# Patient Record
Sex: Female | Born: 1975 | Race: White | Hispanic: No | Marital: Single | Smoking: Never smoker
Health system: Southern US, Community
[De-identification: ages and names within clinical notes are randomized; demographics above are authoritative.]

## PROBLEM LIST (undated history)

## (undated) DIAGNOSIS — F502 Bulimia nervosa, unspecified: Secondary | ICD-10-CM

## (undated) DIAGNOSIS — R51 Headache: Secondary | ICD-10-CM

## (undated) DIAGNOSIS — F329 Major depressive disorder, single episode, unspecified: Secondary | ICD-10-CM

## (undated) DIAGNOSIS — F319 Bipolar disorder, unspecified: Secondary | ICD-10-CM

## (undated) DIAGNOSIS — R7302 Impaired glucose tolerance (oral): Secondary | ICD-10-CM

## (undated) DIAGNOSIS — F32A Depression, unspecified: Secondary | ICD-10-CM

## (undated) DIAGNOSIS — R519 Headache, unspecified: Secondary | ICD-10-CM

## (undated) DIAGNOSIS — R630 Anorexia: Secondary | ICD-10-CM

## (undated) HISTORY — DX: Anorexia: R63.0

## (undated) HISTORY — DX: Bulimia nervosa, unspecified: F50.20

## (undated) HISTORY — DX: Bipolar disorder, unspecified: F31.9

## (undated) HISTORY — DX: Headache: R51

## (undated) HISTORY — DX: Depression, unspecified: F32.A

## (undated) HISTORY — DX: Headache, unspecified: R51.9

## (undated) HISTORY — DX: Impaired glucose tolerance (oral): R73.02

## (undated) HISTORY — DX: Major depressive disorder, single episode, unspecified: F32.9

## (undated) HISTORY — DX: Bulimia nervosa: F50.2

---
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
## 2008-02-25 ENCOUNTER — Emergency Department (HOSPITAL_COMMUNITY): Admission: EM | Admit: 2008-02-25 | Discharge: 2008-02-25 | Payer: Self-pay | Admitting: Family Medicine

## 2011-07-09 LAB — POCT URINALYSIS DIP (DEVICE)
Bilirubin Urine: NEGATIVE
Ketones, ur: NEGATIVE
Nitrite: POSITIVE — AB
Operator id: 247071
pH: 5.5

## 2013-10-26 ENCOUNTER — Encounter (INDEPENDENT_AMBULATORY_CARE_PROVIDER_SITE_OTHER): Payer: Self-pay

## 2013-10-26 ENCOUNTER — Encounter: Payer: Self-pay | Admitting: Neurology

## 2013-10-26 ENCOUNTER — Ambulatory Visit (INDEPENDENT_AMBULATORY_CARE_PROVIDER_SITE_OTHER): Payer: BC Managed Care – PPO | Admitting: Neurology

## 2013-10-26 VITALS — BP 129/78 | HR 82 | Ht 72.25 in | Wt 208.0 lb

## 2013-10-26 DIAGNOSIS — R404 Transient alteration of awareness: Secondary | ICD-10-CM

## 2013-10-26 DIAGNOSIS — R55 Syncope and collapse: Secondary | ICD-10-CM

## 2013-10-26 DIAGNOSIS — R519 Headache, unspecified: Secondary | ICD-10-CM | POA: Insufficient documentation

## 2013-10-26 DIAGNOSIS — R4020 Unspecified coma: Secondary | ICD-10-CM | POA: Insufficient documentation

## 2013-10-26 DIAGNOSIS — R51 Headache: Secondary | ICD-10-CM

## 2013-10-26 MED ORDER — TOPIRAMATE 25 MG PO TABS: 25.0000 mg | ORAL_TABLET | Freq: Two times a day (BID) | ORAL | Status: DC

## 2013-10-26 NOTE — Progress Notes (Signed)
[facility]    Provider:  Dr Hosie PoissonSumner Referring Provider: Minda MeoAronson, Richard A, MD Primary Care Physician:  Minda MeoARONSON,RICHARD A, MD  CC:  AMS  HPI:  Aimee Wright is a 38 y.o. female here as a referral from Dr. Jacky KindleAronson for AMS  Patient having multiple episodes of LOC, severe nausea, really bad headaches, fatigue, depression. Has been getting progressively worse over the past few months. She notes steady increase in headaches, have nausea with them. Increased level of fatigue. Initially thought it was bipolar but has not come out of the down. In the past 6 weeks has had 4 episodes of LOC, typically occur after a severe headache. Had similar symptoms in the past, evaluated by neurologist, told symptoms were likely related to anxiety. Had been stable up until few months ago.  Headaches are typically whole head, can be bifrontal. Headaches are typically a squeezing type pain with some pulsating. Gets nausea. No blurry vision with the headache. Some photophobia. Gets some dizzy sensation. Typically will have a baseline daily headache that fluctuates. No focal motor or sensory changes. Gets an aura of a "weird sensation" prior to the headache.   Most recent LOC episode was Monday. Monday night notes feeling very fatigued, came on suddenly, stood up, felt strange, drove home, recalls this was around 10pm. Next thing she recalls is waking up on the floor the next morning. No tongue biting, no loss of urinary incontinence. Prior events have been similar. Only one event has been witnessed. Told she gets pale, gets "out of it" and then drops, told only out for a few minutes, confused upon waking up. No shaking episodes. Prior to event, gets palpitations, diaphoretic, graying out of vision.   Has history of Bipolar and depression for which she is followed by psychiatry. Has been on a regimen of lamictal and brintellix (along with klonopin PRN) with seroquel recently added.   Had autoimmune,  room workup by outside physician, per patient was unremarkable.   Review of Systems: Out of a complete 14 system review, the patient complains of only the following symptoms, and all other reviewed systems are negative. Positive for fatigue easy bruising feeling cold increased thirst dizziness passing out headaches sleepiness restless legs depression anxiety too much sleep decreased energy change in appetite disinterest in activities  History   Social History  . Marital Status: Single    Spouse Name: N/A    Number of Children: 0  . Years of Education: Masters   Occupational History  . Social Worker    Social History Main Topics  . Smoking status: Never Smoker   . Smokeless tobacco: Not on file  . Alcohol Use: 0.0 oz/week     Comment: 1 glass a wine daily   . Drug Use: No  . Sexual Activity: Not on file   Other Topics Concern  . Not on file   Social History Narrative   Patient lives at home with boyfriend   Patient is right handed   Education level is Masters degree   Caffeine consumption is 2 cups per day    Family History  Problem Relation Age of Onset  . Cancer - Colon Maternal Grandfather     Past Medical History  Diagnosis Date  . Impaired glucose tolerance   . Anorexia   . Bulimia   . Depression   . Bipolar disorder, unspecified   . Generalized headaches     Past Surgical History  Procedure Laterality Date  . Knee surgery  1997  Current Outpatient Prescriptions  Medication Sig Dispense Refill  . BRINTELLIX 10 MG TABS Take 10 mg by mouth daily.      Marland Kitchen lamoTRIgine (LAMICTAL) 100 MG tablet Take 100 mg by mouth 2 (two) times daily.      Marland Kitchen LORazepam (ATIVAN) 0.5 MG tablet Take 0.5 mg by mouth every 8 (eight) hours.      Marland Kitchen QUEtiapine (SEROQUEL) 25 MG tablet Take 25 mg by mouth at bedtime.       No current facility-administered medications for this visit.    Allergies as of 10/26/2013  . (No Known Allergies)    Vitals: BP 129/78  Pulse 82  Ht  6' 0.25" (1.835 m)  Wt 208 lb (94.348 kg)  BMI 28.02 kg/m2 Last Weight:  Wt Readings from Last 1 Encounters:  10/26/13 208 lb (94.348 kg)   Last Height:   Ht Readings from Last 1 Encounters:  10/26/13 6' 0.25" (1.835 m)     Physical exam: Exam: Gen: NAD, conversant Eyes: anicteric sclerae, moist conjunctivae HENT: Atraumatic, oropharynx clear Neck: Trachea midline; supple,  Lungs: CTA, no wheezing, rales, rhonic                          CV: RRR, no MRG Abdomen: Soft, non-tender;  Extremities: No peripheral edema  Skin: Normal temperature, no rash,  Psych: Appropriate affect, pleasant  Neuro: MS: AA&Ox3, appropriately interactive, normal affect   Speech: fluent w/o paraphasic error  Memory: good recent and remote recall  CN: PERRL, EOMI no nystagmus, no ptosis, sensation intact to LT V1-V3 bilat, face symmetric, no weakness, hearing grossly intact, palate elevates symmetrically, shoulder shrug 5/5 bilat,  tongue protrudes midline, no fasiculations noted.  Motor: normal bulk and tone Strength: 5/5  In all extremities  Coord: rapid alternating and point-to-point (FNF, HTS) movements intact.  Reflexes: symmetrical, bilat downgoing toes  Sens: LT intact in all extremities  Gait: posture, stance, stride and arm-swing normal. Tandem gait intact. Able to walk on heels and toes. Romberg absent.   Assessment:  After physical and neurologic examination, review of laboratory studies, imaging, neurophysiology testing and pre-existing records, assessment will be reviewed on the problem list.  Plan:  Treatment plan and additional workup will be reviewed under Problem List.  1)Headache 2)Loss of consciousness 3)Bipolar/depression  38 year old woman with history of bipolar disorder and anxiety presenting for initial evaluation of headache, episodes of loss of consciousness and fatigue. These are negative progressively worse over the past few months. She was recently evaluated  by her psychiatrist who added Seroquel nightly. Unclear etiology of her symptoms. Physical exam is overall unremarkable, making a central process less likely. Headaches appear migrainous in nature. Unclear cause of episodes of loss of consciousness, differential would include seizure versus syncope versus stress anxiety related. Will check EEG, carotid ultrasound, 2-D echo. Patient has had a brain MRI in the past which was unremarkable. Will hold off on repeat imaging at this time. Will start patient on Topamax 25 mg twice a day for symptomatic relief of headache. Followup in one to 2 months. Patient counseled to avoid driving as workup is undergoing.   Elspeth Cho, DO  [facility] [address] [place], [place] [address]  Phone [phone] Fax [phone]

## 2013-10-26 NOTE — Patient Instructions (Addendum)
Overall you are doing fairly well but I do want to suggest a few things today:   Remember to drink plenty of fluid, eat healthy meals and do not skip any meals. Try to eat protein with a every meal and eat a healthy snack such as fruit or nuts in between meals. Try to keep a regular sleep-wake schedule and try to exercise daily, particularly in the form of walking, 20-30 minutes a day, if you can.   As far as your medications are concerned, I would like to suggest the following: 1)Start Topamax 25mg  twice a day 2)Use ibuprofen as needed for headache relief  As far as diagnostic testing:  1)Please schedule an EEG, carotid ultrasound and 2D echo of your heart to evaluate for possible causes of your blacking out episodes.   I would like to see you back in 1 to 2 months, sooner if we need to. Please call us with any interim questions, concerns, problems, updates or refill requests.   Please refrain from driving while we continue working up the cause of your symptoms  Please also call us for any test results so we can go over those with you on the phone.  My clinical assistant and will answer any of your questions and relay your messages to me and also relay most of my messages to you.   Our phone number is [phone]. We also have an after hours call service for urgent matters and there is a physician on-call for urgent questions. For any emergencies you know to call 911 or go to the nearest emergency room

## 2013-10-27 NOTE — Addendum Note (Signed)
Addended by: Ramond MarrowSUMNER, Krist Rosenboom J on: 10/27/2013 03:41 PM   Modules accepted: Orders

## 2013-11-03 ENCOUNTER — Ambulatory Visit (INDEPENDENT_AMBULATORY_CARE_PROVIDER_SITE_OTHER): Payer: BC Managed Care – PPO | Admitting: Radiology

## 2013-11-03 DIAGNOSIS — R55 Syncope and collapse: Secondary | ICD-10-CM

## 2013-11-03 DIAGNOSIS — R4020 Unspecified coma: Secondary | ICD-10-CM

## 2013-11-03 DIAGNOSIS — R404 Transient alteration of awareness: Secondary | ICD-10-CM

## 2013-11-15 ENCOUNTER — Other Ambulatory Visit (HOSPITAL_COMMUNITY): Payer: Self-pay

## 2013-11-18 ENCOUNTER — Ambulatory Visit (INDEPENDENT_AMBULATORY_CARE_PROVIDER_SITE_OTHER): Payer: BC Managed Care – PPO

## 2013-11-18 DIAGNOSIS — R55 Syncope and collapse: Secondary | ICD-10-CM

## 2013-11-18 DIAGNOSIS — G9389 Other specified disorders of brain: Secondary | ICD-10-CM

## 2013-11-18 DIAGNOSIS — R4020 Unspecified coma: Secondary | ICD-10-CM

## 2013-11-24 ENCOUNTER — Telehealth: Payer: Self-pay | Admitting: Neurology

## 2013-11-24 NOTE — Telephone Encounter (Signed)
Patient's mother calling requesting if patient could be seen by Dr. Vickey Hugerohmeier or Dr. Hosie PoissonSumner tomorrow if there are cancellations as she is feeling quite ill. She does have an appointment next Tuesday. Please call to advise [phone]

## 2013-11-24 NOTE — Procedures (Signed)
   [facility]  EEG (ELECTROENCEPHALOGRAM) REPORT   STUDY DATE: 11/03/13 PATIENT NAME: Aimee Wright DOB: 10/09/1976 MRN: [id number]  ORDERING CLINICIAN: Elspeth ChoPeter Sumner, DO   TECHNOLOGIST: Kaylyn LimSue Fox TECHNIQUE: Electroencephalogram was recorded utilizing standard 10-20 system of lead placement and reformatted into average and bipolar montages.  RECORDING TIME: 30 minutes ACTIVATION: hyperventilation and photic stimulation  CLINICAL INFORMATION: 38 year old female with seizure vs syncope.  FINDINGS: Background rhythms of 9-10 hertz and 60-70 microvolts. No focal, lateralizing, epileptiform activity or seizures are seen. Patient recorded in the awake state.   IMPRESSION:  Normal EEG in the awake state.   INTERPRETING PHYSICIAN:  Suanne MarkerVIKRAM R. Xachary Hambly, MD Certified in Neurology, Neurophysiology and Neuroimaging  [facility] [address], [address] [place], [place] [address] [phone]

## 2013-11-25 ENCOUNTER — Other Ambulatory Visit: Payer: Self-pay | Admitting: Neurology

## 2013-11-25 ENCOUNTER — Telehealth: Payer: Self-pay | Admitting: Neurology

## 2013-11-25 MED ORDER — MECLIZINE HCL 12.5 MG PO TABS: 12.5000 mg | ORAL_TABLET | Freq: Three times a day (TID) | ORAL | Status: DC | PRN

## 2013-11-25 NOTE — Telephone Encounter (Signed)
Patient is requesting a sooner appt.( for dizziness and  Headaches) or cancellation slot today, informed that Dr Hosie PoissonSumner does not have any available today, she is scheduled for visit on 11/29/13, spoke with father and he said that she would also like the results of EEG completed on 11/24/13.

## 2013-11-25 NOTE — Telephone Encounter (Signed)
Returned call, discussed with patients mother. Will start meclizine 12.5mg  TID as needed. Follow up with scheduled appointment.

## 2013-11-25 NOTE — Telephone Encounter (Signed)
Called patient to reschedule her appointment with Dr. Hosie PoissonSumner on 11/29/13, no answer left message to return call.

## 2013-11-28 NOTE — Telephone Encounter (Signed)
Called patient again to reschedule her appointment on 02/17 with Hosie PoissonSumner, due to provider schedule change

## 2013-11-28 NOTE — Telephone Encounter (Signed)
Spoke with patients mother Carol, appoinOkey Regaltment changed to 3:30 pm per Manson PasseySumner, Carol Heinemann states that this time works better for patient

## 2013-11-29 ENCOUNTER — Encounter (INDEPENDENT_AMBULATORY_CARE_PROVIDER_SITE_OTHER): Payer: Self-pay

## 2013-11-29 ENCOUNTER — Encounter: Payer: Self-pay | Admitting: Neurology

## 2013-11-29 ENCOUNTER — Ambulatory Visit (INDEPENDENT_AMBULATORY_CARE_PROVIDER_SITE_OTHER): Payer: BC Managed Care – PPO | Admitting: Neurology

## 2013-11-29 ENCOUNTER — Telehealth: Payer: Self-pay | Admitting: Neurology

## 2013-11-29 VITALS — BP 117/73 | HR 96 | Ht 72.0 in | Wt 213.0 lb

## 2013-11-29 DIAGNOSIS — R4182 Altered mental status, unspecified: Secondary | ICD-10-CM

## 2013-11-29 NOTE — Patient Instructions (Signed)
Overall you are doing fairly well but I do want to suggest a few things today:   Remember to drink plenty of fluid, eat healthy meals and do not skip any meals. Try to eat protein with a every meal and eat a healthy snack such as fruit or nuts in between meals. Try to keep a regular sleep-wake schedule and try to exercise daily, particularly in the form of walking, 20-30 minutes a day, if you can.   As far as your medications are concerned, I would like to suggest the following: 1)Please continue on the Topamax as prescribed  [facility]  I would like to see you back once the EEG is completed, sooner if we need to. Please call us with any interim questions, concerns, problems, updates or refill requests.   My clinical assistant and will answer any of your questions and relay your messages to me and also relay most of my messages to you.   Our phone number is [phone]. We also have an after hours call service for urgent matters and there is a physician on-call for urgent questions. For any emergencies you know to call 911 or go to the nearest emergency room

## 2013-11-29 NOTE — Telephone Encounter (Signed)
Patient called returning my call about her appointment today with Dr. Hosie PoissonSumner, patient agreed to arrive early states she will be here at 2pm
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
## 2013-12-05 ENCOUNTER — Telehealth: Payer: Self-pay | Admitting: Neurology

## 2013-12-05 NOTE — Telephone Encounter (Signed)
PT called and wanted to let Dr. Hosie PoissonSumner know that she had another episode - "black out" last night.  She did not go to work today as she is having extreme dizziness and disorientation.  The other thing she asked is if Dr. Hosie PoissonSumner could change the referral out that [facility] was going to [facility].  Please call with any questions.  Thank you

## 2013-12-05 NOTE — Telephone Encounter (Signed)
Hi Sandy,   Can you please change the ambulatory EEG referral to [facility]. Thanks.

## 2013-12-07 ENCOUNTER — Other Ambulatory Visit: Payer: Self-pay | Admitting: *Deleted

## 2013-12-07 DIAGNOSIS — R4182 Altered mental status, unspecified: Secondary | ICD-10-CM

## 2013-12-07 NOTE — Addendum Note (Signed)
Addended byHermenia Fiscal: Ily Denno on: 12/07/2013 11:38 AM   Modules accepted: Orders

## 2013-12-12 NOTE — Telephone Encounter (Signed)
Shree from [facility] calling to state that she can't make the patient's EEG appointment unless she has the doctor's name and whenever she tries to type it, Dr. Minus BreedingSumner's name isn't coming up. Please call her and advise at [phone].

## 2013-12-22 ENCOUNTER — Encounter: Payer: Self-pay | Admitting: Neurology

## 2013-12-28 ENCOUNTER — Telehealth: Payer: Self-pay | Admitting: Neurology

## 2013-12-28 NOTE — Telephone Encounter (Signed)
[facility] and they said that patient has been scheduled for 01/09/14@1 :00,arrival time-1:00, patient verbalized understanding, gave her their phone number if she needs to call.

## 2013-12-28 NOTE — Telephone Encounter (Signed)
Patient calling to state that the number that Dr. Hosie PoissonSumner gave her for her [facility] referral was not the right number, patient calling to request the correct number. Please call and advise patient.

## 2013-12-28 NOTE — Telephone Encounter (Signed)
Patient informed of appt.

## 2014-03-27 ENCOUNTER — Other Ambulatory Visit: Payer: Self-pay | Admitting: Internal Medicine

## 2014-03-27 DIAGNOSIS — R55 Syncope and collapse: Secondary | ICD-10-CM

## 2014-03-29 ENCOUNTER — Ambulatory Visit
Admission: RE | Admit: 2014-03-29 | Discharge: 2014-03-29 | Disposition: A | Discharge status: Home / Self Care | Payer: BC Managed Care – PPO | Source: Ambulatory Visit | Attending: Internal Medicine | Admitting: Internal Medicine

## 2014-03-29 DIAGNOSIS — R55 Syncope and collapse: Secondary | ICD-10-CM

## 2014-07-28 ENCOUNTER — Other Ambulatory Visit: Payer: Self-pay

## 2016-07-31 ENCOUNTER — Ambulatory Visit (HOSPITAL_COMMUNITY)
Admission: EM | Admit: 2016-07-31 | Discharge: 2016-07-31 | Disposition: A | Discharge status: Home / Self Care | Payer: Self-pay | Attending: Family Medicine | Admitting: Family Medicine

## 2016-07-31 ENCOUNTER — Encounter (HOSPITAL_COMMUNITY): Payer: Self-pay | Admitting: Emergency Medicine

## 2016-07-31 DIAGNOSIS — S40011A Contusion of right shoulder, initial encounter: Secondary | ICD-10-CM

## 2016-07-31 DIAGNOSIS — M25511 Pain in right shoulder: Secondary | ICD-10-CM

## 2016-07-31 MED ORDER — TRAMADOL HCL 50 MG PO TABS: 50.0000 mg | ORAL_TABLET | Freq: Four times a day (QID) | ORAL | 0 refills | Status: DC | PRN

## 2016-07-31 MED ORDER — DICLOFENAC SODIUM 1 % TD GEL
1.0000 | Freq: Four times a day (QID) | TRANSDERMAL | 0 refills | Status: DC
Start: 2016-07-31 — End: 2020-06-28

## 2016-07-31 MED ORDER — NAPROXEN 375 MG PO TABS: 375.0000 mg | ORAL_TABLET | Freq: Two times a day (BID) | ORAL | 0 refills | Status: DC

## 2016-07-31 NOTE — ED Triage Notes (Signed)
Pt was walking through a sliding glass door when the entire framework fell on her right shoulder, near her neck.  Pt had to hold the frame up until people came to help get it off.  She has pain and tenderness all along the area between her neck and her shoulder.

## 2016-07-31 NOTE — ED Provider Notes (Signed)
CSN: [id number]     Arrival date & time 07/31/16  1800 History   None    Chief Complaint  Patient presents with  . Shoulder Pain   (Consider location/radiation/quality/duration/timing/severity/associated sxs/prior Treatment) 40 year old female [place] and the door frame which was being worked on at the time fell over her head and struck her in the right shoulder along the ridge of the trapezius. She is currently complaining of pain over the trapezius muscle as well as the supraspinatus muscle. Later she developed some minor paresthesias of the right forearm. She also developed lower para thoracic and upper lumbar muscle soreness. She is fully ambulatory and showing no signs of distress.      Past Medical History:  Diagnosis Date  . Anorexia   . Bipolar disorder, unspecified   . Bulimia   . Depression   . Generalized headaches   . Impaired glucose tolerance    Past Surgical History:  Procedure Laterality Date  . KNEE SURGERY  1997   Family History  Problem Relation Age of Onset  . Cancer - Colon Maternal Grandfather    Social History  Substance Use Topics  . Smoking status: Never Smoker  . Smokeless tobacco: Never Used  . Alcohol use 0.0 oz/week     Comment: 1 glass a wine daily    OB History    No data available     Review of Systems  Constitutional: Negative for activity change, chills and fever.  HENT: Negative.   Respiratory: Negative.   Cardiovascular: Negative.   Musculoskeletal: Positive for myalgias. Negative for arthralgias, neck pain and neck stiffness.       As per HPI  Skin: Negative for color change, pallor and rash.  Neurological: Negative.   All other systems reviewed and are negative.   Allergies  Review of patient's allergies indicates no known allergies.  Home Medications   Prior to Admission medications   Medication Sig Start Date End Date Taking? Authorizing Provider  BRINTELLIX 10 MG TABS  Take 10 mg by mouth daily. 10/17/13  Yes Historical Provider, MD  lamoTRIgine (LAMICTAL) 100 MG tablet Take 100 mg by mouth 2 (two) times daily. 09/25/13  Yes Historical Provider, MD  LORazepam (ATIVAN) 0.5 MG tablet Take 0.5 mg by mouth every 8 (eight) hours.   Yes Historical Provider, MD  QUEtiapine (SEROQUEL) 25 MG tablet Take 25 mg by mouth at bedtime.   Yes Historical Provider, MD  topiramate (TOPAMAX) 25 MG tablet Take 1 tablet (25 mg total) by mouth 2 (two) times daily. 10/26/13  Yes Ramond MarrowPeter J Sumner, DO  diclofenac sodium (VOLTAREN) 1 % GEL Apply 1 application topically 4 (four) times daily. 07/31/16   Hayden Rasmussenavid Marlise Fahr, NP  meclizine (ANTIVERT) 12.5 MG tablet Take 1 tablet (12.5 mg total) by mouth 3 (three) times daily as needed for dizziness. 11/25/13   Ramond MarrowPeter J Sumner, DO  naproxen (NAPROSYN) 375 MG tablet Take 1 tablet (375 mg total) by mouth 2 (two) times daily. 07/31/16   Hayden Rasmussenavid Aryn Kops, NP  traMADol (ULTRAM) 50 MG tablet Take 1 tablet (50 mg total) by mouth every 6 (six) hours as needed. 07/31/16   Hayden Rasmussenavid Joni Norrod, NP   Meds Ordered and Administered this Visit  Medications - No data to display  BP 121/67 (BP Location: Left Arm)   Pulse 77   Temp 98.6 F (37 C) (Oral)   Resp 16   LMP 07/07/2016 (Exact Date)   SpO2 100%  No data found.   Physical Exam  Constitutional: She is oriented to person, place, and time. She appears well-developed and well-nourished. No distress.  HENT:  Head: Normocephalic and atraumatic.  Eyes: EOM are normal. Pupils are equal, round, and reactive to light.  Neck: Normal range of motion. Neck supple.  Cardiovascular: Normal rate.   Pulmonary/Chest: Effort normal and breath sounds normal.  Musculoskeletal: Normal range of motion.  Tenderness across the ridge of the right trapezius muscle. Tenderness along the scapular spine and supraspinatus muscle. patient is able to shrug her shoulders completely. Range of motion of the right shoulder joint is complete and without  limitation. No local joint tenderness. There is no swelling or discoloration. Minor tenderness to the lower parathoracic and upper paralumbar musculature. No tenderness, discoloration, swelling or deformity along the length of the spine. Right upper extremity strength is 5 over 5. Full range of motion of the right upper extremity radial pulse 2+. Distal neurovascular motor sensory intact.   Lymphadenopathy:    She has no cervical adenopathy.  Neurological: She is alert and oriented to person, place, and time. No cranial nerve deficit.  Skin: Skin is warm and dry. Capillary refill takes less than 2 seconds.  Psychiatric: She has a normal mood and affect.  Nursing note and vitals reviewed.   Urgent Care Course   Clinical Course    Procedures (including critical care time)  Labs Review Labs Reviewed - No data to display  Imaging Review No results found.   Visual Acuity Review  Right Eye Distance:   Left Eye Distance:   Bilateral Distance:    Right Eye Near:   Left Eye Near:    Bilateral Near:         MDM   1. Acute pain of right shoulder   2. Contusion of right shoulder, initial encounter    Apply ice to the muscles of the right shoulder for the first couple days then switch to heat. Apply the diclofenac gel 4 times a day and take the Naprosyn twice a day with food as needed for pain. For moderate pain may also take the tramadol, this may cause drowsiness. For worsening, new symptoms or problems may return or follow-up with primary care doctor. Meds ordered this encounter  Medications  . diclofenac sodium (VOLTAREN) 1 % GEL    Sig: Apply 1 application topically 4 (four) times daily.    Dispense:  100 g    Refill:  0    Order Specific Question:   Supervising Provider    Answer:   Linna Hoff [phone]  . naproxen (NAPROSYN) 375 MG tablet    Sig: Take 1 tablet (375 mg total) by mouth 2 (two) times daily.    Dispense:  20 tablet    Refill:  0    Order Specific  Question:   Supervising Provider    Answer:   Linna Hoff [phone]  . traMADol (ULTRAM) 50 MG tablet    Sig: Take 1 tablet (50 mg total) by mouth every 6 (six) hours as needed.    Dispense:  15 tablet    Refill:  0    Order Specific Question:   Supervising Provider    Answer:   Linna Hoff [[id number]]       Hayden Rasmussen, NP 07/31/16 1912

## 2016-07-31 NOTE — Discharge Instructions (Signed)
Apply ice to the muscles of the right shoulder for the first couple days then switch to heat. Apply the diclofenac gel 4 times a day and take the Naprosyn twice a day with food as needed for pain. For moderate pain may also take the tramadol, this may cause drowsiness. For worsening, new symptoms or problems may return or follow-up with primary care doctor.

## 2016-09-30 ENCOUNTER — Encounter (INDEPENDENT_AMBULATORY_CARE_PROVIDER_SITE_OTHER): Payer: Self-pay | Admitting: Orthopaedic Surgery

## 2016-09-30 ENCOUNTER — Ambulatory Visit (INDEPENDENT_AMBULATORY_CARE_PROVIDER_SITE_OTHER): Payer: BLUE CROSS/BLUE SHIELD

## 2016-09-30 ENCOUNTER — Ambulatory Visit (INDEPENDENT_AMBULATORY_CARE_PROVIDER_SITE_OTHER): Payer: BLUE CROSS/BLUE SHIELD | Admitting: Orthopaedic Surgery

## 2016-09-30 DIAGNOSIS — M25512 Pain in left shoulder: Secondary | ICD-10-CM | POA: Diagnosis not present

## 2016-09-30 DIAGNOSIS — G8929 Other chronic pain: Secondary | ICD-10-CM

## 2016-09-30 DIAGNOSIS — M25561 Pain in right knee: Secondary | ICD-10-CM

## 2016-09-30 MED ORDER — LIDOCAINE HCL 1 % IJ SOLN
3.0000 mL | INTRAMUSCULAR | Status: AC | PRN
  Administered 2016-09-30: 3 mL

## 2016-09-30 MED ORDER — METHYLPREDNISOLONE ACETATE 40 MG/ML IJ SUSP
40.0000 mg | INTRAMUSCULAR | Status: AC | PRN
  Administered 2016-09-30: 40 mg via INTRA_ARTICULAR

## 2016-09-30 MED ORDER — BUPIVACAINE HCL 0.5 % IJ SOLN
3.0000 mL | INTRAMUSCULAR | Status: AC | PRN
  Administered 2016-09-30: 3 mL via INTRA_ARTICULAR
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
## 2017-02-05 ENCOUNTER — Ambulatory Visit (INDEPENDENT_AMBULATORY_CARE_PROVIDER_SITE_OTHER): Payer: BLUE CROSS/BLUE SHIELD | Admitting: Orthopaedic Surgery

## 2017-02-09 ENCOUNTER — Encounter (INDEPENDENT_AMBULATORY_CARE_PROVIDER_SITE_OTHER): Payer: Self-pay | Admitting: Orthopaedic Surgery

## 2017-02-09 ENCOUNTER — Ambulatory Visit (INDEPENDENT_AMBULATORY_CARE_PROVIDER_SITE_OTHER): Payer: Self-pay

## 2017-02-09 ENCOUNTER — Ambulatory Visit (INDEPENDENT_AMBULATORY_CARE_PROVIDER_SITE_OTHER): Payer: BLUE CROSS/BLUE SHIELD | Admitting: Orthopaedic Surgery

## 2017-02-09 DIAGNOSIS — G8929 Other chronic pain: Secondary | ICD-10-CM

## 2017-02-09 DIAGNOSIS — M25562 Pain in left knee: Secondary | ICD-10-CM

## 2017-02-09 MED ORDER — MELOXICAM 7.5 MG PO TABS: 15.0000 mg | ORAL_TABLET | Freq: Every day | ORAL | 2 refills | Status: DC | PRN

## 2017-02-09 MED ORDER — METHYLPREDNISOLONE ACETATE 40 MG/ML IJ SUSP
40.0000 mg | INTRAMUSCULAR | Status: AC | PRN
  Administered 2017-02-09: 40 mg via INTRA_ARTICULAR

## 2017-02-09 MED ORDER — BUPIVACAINE HCL 0.5 % IJ SOLN
2.0000 mL | INTRAMUSCULAR | Status: AC | PRN
  Administered 2017-02-09: 2 mL via INTRA_ARTICULAR

## 2017-02-09 MED ORDER — LIDOCAINE HCL 1 % IJ SOLN
2.0000 mL | INTRAMUSCULAR | Status: AC | PRN
  Administered 2017-02-09: 2 mL

## 2017-02-09 NOTE — Progress Notes (Signed)
Office Visit Note   Patient: Aimee Wright           Date of Birth: 27-May-1976           MRN: [id number] Visit Date: 02/09/2017              Requested by: Geoffry Paradise, MD [address] [place], [place] [address] PCP: Minda Meo, MD   Assessment & Plan: Visit Diagnoses:  1. Chronic pain of left knee     Plan: Impression is worsening chondromalacia patella. She did not do physical therapy as prescribed. I gave her prescription for meloxicam. Left knee injection was performed. Knee brace was also applied today. We discussed possibly getting an MRI but will hold on that for now. Follow-up with me as needed.  Follow-Up Instructions: Return if symptoms worsen or fail to improve.   Orders:  Orders Placed This Encounter  Procedures  . XR KNEE 3 VIEW LEFT   Meds ordered this encounter  Medications  . meloxicam (MOBIC) 7.5 MG tablet    Sig: Take 2 tablets (15 mg total) by mouth daily as needed for pain.    Dispense:  30 tablet    Refill:  2      Procedures: Large Joint Inj Date/Time: 02/09/2017 1:30 PM Performed by: Tarry Kos Authorized by: Tarry Kos   Consent Given by:  Patient Timeout: prior to procedure the correct patient, procedure, and site was verified   Indications:  Pain Location:  Knee Site:  R knee Prep: patient was prepped and draped in usual sterile fashion   Needle Size:  22 G Ultrasound Guidance: No   Fluoroscopic Guidance: No   Arthrogram: No   Medications:  2 mL lidocaine 1 %; 2 mL bupivacaine 0.5 %; 40 mg methylPREDNISolone acetate 40 MG/ML Patient tolerance:  Patient tolerated the procedure well with no immediate complications     Clinical Data: No additional findings.   Subjective: Chief Complaint  Patient presents with  . Left Knee - Pain    [name] comes back today for continued left knee pain. She endorses pain along the superior portion of the kneecap. It is especially bad when she gets up and down and out of a car  and up and down stairs. She does endorse occasional swelling that improves with icing and Advil. She denies any true mechanical symptoms. She does endorse popping and occasional giving way.    Review of Systems  Constitutional: Negative.   HENT: Negative.   Eyes: Negative.   Respiratory: Negative.   Cardiovascular: Negative.   Endocrine: Negative.   Musculoskeletal: Negative.   Neurological: Negative.   Hematological: Negative.   Psychiatric/Behavioral: Negative.   All other systems reviewed and are negative.    Objective: Vital Signs: There were no vitals taken for this visit.  Physical Exam  Constitutional: She is oriented to person, place, and time. She appears well-developed and well-nourished.  Pulmonary/Chest: Effort normal.  Neurological: She is alert and oriented to person, place, and time.  Skin: Skin is warm. Capillary refill takes less than 2 seconds.  Psychiatric: She has a normal mood and affect. Her behavior is normal. Judgment and thought content normal.  Nursing note and vitals reviewed.   Ortho Exam Left knee exam shows no joint effusion. She does have palpable patellar crepitus. Specialty Comments:  No specialty comments available.  Imaging: No results found.   PMFS History: Patient Active Problem List   Diagnosis Date Noted  . Headache(784.0) 10/26/2013  . Loss  of consciousness (HCC) 10/26/2013   Past Medical History:  Diagnosis Date  . Anorexia   . Bipolar disorder, unspecified (HCC)   . Bulimia   . Depression   . Generalized headaches   . Impaired glucose tolerance     Family History  Problem Relation Age of Onset  . Cancer - Colon Maternal Grandfather     Past Surgical History:  Procedure Laterality Date  . KNEE SURGERY  1997   Social History   Occupational History  . Social Worker    Social History Main Topics  . Smoking status: Never Smoker  . Smokeless tobacco: Never Used  . Alcohol use 0.0 oz/week     Comment: 1 glass a  wine daily   . Drug use: No  . Sexual activity: Not on file

## 2018-03-12 ENCOUNTER — Other Ambulatory Visit: Payer: Self-pay | Admitting: Obstetrics and Gynecology

## 2018-03-12 DIAGNOSIS — R928 Other abnormal and inconclusive findings on diagnostic imaging of breast: Secondary | ICD-10-CM

## 2018-03-15 ENCOUNTER — Ambulatory Visit
Admission: RE | Admit: 2018-03-15 | Discharge: 2018-03-15 | Disposition: A | Discharge status: Home / Self Care | Payer: BLUE CROSS/BLUE SHIELD | Source: Ambulatory Visit | Attending: Obstetrics and Gynecology | Admitting: Obstetrics and Gynecology

## 2018-03-15 ENCOUNTER — Other Ambulatory Visit: Payer: Self-pay | Admitting: Obstetrics and Gynecology

## 2018-03-15 DIAGNOSIS — R928 Other abnormal and inconclusive findings on diagnostic imaging of breast: Secondary | ICD-10-CM

## 2018-03-15 DIAGNOSIS — N6310 Unspecified lump in the right breast, unspecified quadrant: Secondary | ICD-10-CM

## 2018-09-15 ENCOUNTER — Other Ambulatory Visit: Payer: BLUE CROSS/BLUE SHIELD

## 2018-12-17 ENCOUNTER — Other Ambulatory Visit: Payer: BLUE CROSS/BLUE SHIELD

## 2018-12-27 ENCOUNTER — Other Ambulatory Visit: Payer: Self-pay

## 2019-01-19 ENCOUNTER — Other Ambulatory Visit: Payer: Self-pay

## 2019-01-28 ENCOUNTER — Other Ambulatory Visit: Payer: Self-pay

## 2019-03-09 ENCOUNTER — Other Ambulatory Visit: Payer: Self-pay

## 2019-03-11 ENCOUNTER — Other Ambulatory Visit: Payer: Self-pay

## 2019-04-08 ENCOUNTER — Other Ambulatory Visit: Payer: Self-pay

## 2019-04-21 ENCOUNTER — Telehealth: Payer: Self-pay | Admitting: Orthopaedic Surgery

## 2019-04-21 NOTE — Telephone Encounter (Signed)
Returned call to patient left message to call back to schedule an appointment with Dr Erlinda Hong for knee and back pain per patient request   [phone]

## 2019-04-22 ENCOUNTER — Ambulatory Visit
Admission: RE | Admit: 2019-04-22 | Discharge: 2019-04-22 | Disposition: A | Discharge status: Home / Self Care | Payer: Managed Care, Other (non HMO) | Source: Ambulatory Visit | Attending: Obstetrics and Gynecology | Admitting: Obstetrics and Gynecology

## 2019-04-22 ENCOUNTER — Other Ambulatory Visit: Payer: Self-pay | Admitting: Obstetrics and Gynecology

## 2019-04-22 DIAGNOSIS — N6310 Unspecified lump in the right breast, unspecified quadrant: Secondary | ICD-10-CM

## 2019-04-22 DIAGNOSIS — R921 Mammographic calcification found on diagnostic imaging of breast: Secondary | ICD-10-CM

## 2019-04-27 ENCOUNTER — Ambulatory Visit: Payer: Managed Care, Other (non HMO) | Admitting: Orthopaedic Surgery

## 2019-04-29 ENCOUNTER — Ambulatory Visit (INDEPENDENT_AMBULATORY_CARE_PROVIDER_SITE_OTHER): Payer: Managed Care, Other (non HMO) | Admitting: Orthopaedic Surgery

## 2019-04-29 ENCOUNTER — Ambulatory Visit (INDEPENDENT_AMBULATORY_CARE_PROVIDER_SITE_OTHER): Payer: Managed Care, Other (non HMO)

## 2019-04-29 ENCOUNTER — Other Ambulatory Visit: Payer: Self-pay

## 2019-04-29 ENCOUNTER — Encounter: Payer: Self-pay | Admitting: Physician Assistant

## 2019-04-29 DIAGNOSIS — M4807 Spinal stenosis, lumbosacral region: Secondary | ICD-10-CM | POA: Diagnosis not present

## 2019-04-29 DIAGNOSIS — M25562 Pain in left knee: Secondary | ICD-10-CM

## 2019-04-29 DIAGNOSIS — M545 Low back pain, unspecified: Secondary | ICD-10-CM

## 2019-04-29 MED ORDER — METHYLPREDNISOLONE ACETATE 40 MG/ML IJ SUSP
40.0000 mg | INTRAMUSCULAR | Status: AC | PRN
  Administered 2019-04-29: 40 mg via INTRA_ARTICULAR

## 2019-04-29 MED ORDER — LIDOCAINE HCL 1 % IJ SOLN
2.0000 mL | INTRAMUSCULAR | Status: AC | PRN
  Administered 2019-04-29: 2 mL

## 2019-04-29 MED ORDER — BUPIVACAINE HCL 0.25 % IJ SOLN
2.0000 mL | INTRAMUSCULAR | Status: AC | PRN
  Administered 2019-04-29: 2 mL via INTRA_ARTICULAR

## 2019-04-29 MED ORDER — METHOCARBAMOL 500 MG PO TABS: 500.0000 mg | ORAL_TABLET | Freq: Every day | ORAL | 0 refills | Status: DC | PRN

## 2019-04-29 NOTE — Progress Notes (Addendum)
Office Visit Note   Patient: Aimee Wright           Date of Birth: 12-29-1975           MRN: [id number] Visit Date: 04/29/2019              Requested by: Burnard Bunting, MD [address] [place],  [place] [address] PCP: Burnard Bunting, MD   Assessment & Plan: Visit Diagnoses:  1. Left knee pain, unspecified chronicity   2. Low back pain, unspecified back pain laterality, unspecified chronicity, unspecified whether sciatica present   3. Spinal stenosis of lumbosacral region     Plan: Impression is continued left knee and lower back pain following acute injury back in 2017.  At this point, the patient has given this significant time as well as tried conservative treatment to include oral and injectable anti-inflammatories and over two months of physical therapy.  I feel is appropriate to proceed with MRI of the left knee and lumbar spine to further assess internal structures.  She will follow-up with [place] once those have been completed.  In the meantime, we will reinject the left knee with cortisone to try and give her some relief in the meantime.  Follow-Up Instructions: Return in about 3 weeks (around 05/20/2019) for MRI review.   Orders:  Orders Placed This Encounter  Procedures  . Large Joint Inj: L knee  . XR Lumbar Spine 2-3 Views  . XR Knee Complete 4 Views Left  . MR Knee Left w/o contrast  . MR Lumbar Spine w/o contrast   Meds ordered this encounter  Medications  . methocarbamol (ROBAXIN) 500 MG tablet    Sig: Take 1 tablet (500 mg total) by mouth daily as needed for muscle spasms.    Dispense:  10 tablet    Refill:  0  . bupivacaine (MARCAINE) 0.25 % (with pres) injection 2 mL  . lidocaine (XYLOCAINE) 1 % (with pres) injection 2 mL  . methylPREDNISolone acetate (DEPO-MEDROL) injection 40 mg      Procedures: Large Joint Inj: L knee on 04/29/2019 1:39 PM Indications: pain Details: 22 G needle, anterolateral approach Medications: 2 mL bupivacaine 0.25 %; 2 mL  lidocaine 1 %; 40 mg methylPREDNISolone acetate 40 MG/ML      Clinical Data: No additional findings.   Subjective: Chief Complaint  Patient presents with  . Left Knee - Pain  . Lower Back - Pain    HPI patient is a pleasant 43 year old female who presents our clinic today with continued left knee and lower back pain.  Initial injury occurred in October 2017 when she was exiting a hotel in [place] it was under Architect.  The metal frame around the door fell on top of her shoulders which caused her to fall to the ground on the anterior aspect of her knees.  She was initially seen in the ED for this.  She subsequently followed up with Dr.Xu.  She has had bracing, physical therapy as well as cortisone injections to the left knee which is only alleviated her symptoms.  She is now having a constant ache to the entire knee.  Pain is worse with declines.  She has been taking Advil with mild relief of symptoms.  She is also having midline lower back pain which is worse after she has been seated for a long period of time.  She also has increased pain when she is trying to do weighted squats.  At times, the pain becomes become so significant  that she is immobilized.  She denies any weakness to either lower extremity.  No numbness, tingling or burning.  No bowel or bladder incontinence.  No saddle paresthesias.  Review of Systems as detailed in HPI.  All others reviewed and are negative.   Objective: Vital Signs: There were no vitals taken for this visit.  Physical Exam well-developed well-nourished female no acute distress.  Alert and oriented x3.  Ortho Exam examination of her left knee reveals range of motion 0 to 110 degrees.  Marked L femoral crepitus.  Medial joint line tenderness.  Ligaments are stable.  No focal weakness.  Negative straight leg raise both sides.  Increased pain with lumbar extension, otherwise normal lumbar exam.  She is neurovascular intact distally.  Specialty Comments:   No specialty comments available.  Imaging: No results found.   PMFS History: Patient Active Problem List   Diagnosis Date Noted  . Headache(784.0) 10/26/2013  . Loss of consciousness (HCC) 10/26/2013   Past Medical History:  Diagnosis Date  . Anorexia   . Bipolar disorder, unspecified (HCC)   . Bulimia   . Depression   . Generalized headaches   . Impaired glucose tolerance     Family History  Problem Relation Age of Onset  . Cancer - Colon Maternal Grandfather     Past Surgical History:  Procedure Laterality Date  . KNEE SURGERY  1997   Social History   Occupational History  . 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 Smoking status: Never Smoker  . Smokeless tobacco: Never Used  Substance and Sexual Activity  . Alcohol use: Yes    Comment: 1 glass a wine daily   . Drug use: No  . Sexual activity: Not on file

## 2019-05-25 ENCOUNTER — Other Ambulatory Visit: Payer: Self-pay | Admitting: Physician Assistant

## 2019-05-30 ENCOUNTER — Other Ambulatory Visit: Payer: Managed Care, Other (non HMO)

## 2019-06-07 ENCOUNTER — Ambulatory Visit: Payer: Managed Care, Other (non HMO) | Admitting: Orthopaedic Surgery

## 2019-06-13 ENCOUNTER — Encounter: Payer: Self-pay | Admitting: Orthopaedic Surgery

## 2019-11-09 ENCOUNTER — Other Ambulatory Visit: Payer: Managed Care, Other (non HMO)

## 2019-12-14 ENCOUNTER — Inpatient Hospital Stay: Admission: RE | Admit: 2019-12-14 | Payer: Managed Care, Other (non HMO) | Source: Ambulatory Visit

## 2020-04-30 ENCOUNTER — Ambulatory Visit: Payer: Managed Care, Other (non HMO) | Admitting: Family Medicine

## 2020-05-03 ENCOUNTER — Ambulatory Visit: Payer: Managed Care, Other (non HMO) | Admitting: Orthopaedic Surgery

## 2020-05-08 ENCOUNTER — Ambulatory Visit: Payer: Managed Care, Other (non HMO) | Admitting: Orthopaedic Surgery

## 2020-05-09 ENCOUNTER — Ambulatory Visit (INDEPENDENT_AMBULATORY_CARE_PROVIDER_SITE_OTHER): Payer: Managed Care, Other (non HMO) | Admitting: Orthopaedic Surgery

## 2020-05-09 ENCOUNTER — Other Ambulatory Visit: Payer: Self-pay

## 2020-05-09 ENCOUNTER — Ambulatory Visit (INDEPENDENT_AMBULATORY_CARE_PROVIDER_SITE_OTHER): Payer: Managed Care, Other (non HMO)

## 2020-05-09 ENCOUNTER — Encounter: Payer: Self-pay | Admitting: Orthopaedic Surgery

## 2020-05-09 ENCOUNTER — Ambulatory Visit: Payer: Self-pay

## 2020-05-09 VITALS — Ht 71.0 in | Wt 273.2 lb

## 2020-05-09 DIAGNOSIS — G8929 Other chronic pain: Secondary | ICD-10-CM

## 2020-05-09 DIAGNOSIS — M25561 Pain in right knee: Secondary | ICD-10-CM

## 2020-05-09 DIAGNOSIS — M25562 Pain in left knee: Secondary | ICD-10-CM

## 2020-05-09 MED ORDER — LIDOCAINE HCL 1 % IJ SOLN
2.0000 mL | INTRAMUSCULAR | Status: AC | PRN
  Administered 2020-05-09: 2 mL

## 2020-05-09 MED ORDER — METHYLPREDNISOLONE ACETATE 40 MG/ML IJ SUSP
40.0000 mg | INTRAMUSCULAR | Status: AC | PRN
  Administered 2020-05-09: 40 mg via INTRA_ARTICULAR

## 2020-05-09 MED ORDER — BUPIVACAINE HCL 0.5 % IJ SOLN
2.0000 mL | INTRAMUSCULAR | Status: AC | PRN
  Administered 2020-05-09: 2 mL via INTRA_ARTICULAR

## 2020-05-09 NOTE — Progress Notes (Signed)
Office Visit Note   Patient: Aimee Wright           Date of Birth: Sep 09, 1976           MRN: [id number] Visit Date: 05/09/2020              Requested by: Aimee Paradise, MD [address] [place],  [place] [address] PCP: Aimee Paradise, MD   Assessment & Plan: Visit Diagnoses:  1. Chronic pain of both knees     Plan: Impression is right knee osteoarthritis exacerbation and effusion.  We performed aspiration and cortisone injection today.  50 cc obtained.  She tolerated this well.  She will take it easy for a couple days and then increase activity as tolerated.  Follow-up as needed.  Follow-Up Instructions: Return if symptoms worsen or fail to improve.   Orders:  Orders Placed This Encounter  Procedures   XR KNEE 3 VIEW LEFT   XR KNEE 3 VIEW RIGHT   No orders of the defined types were placed in this encounter.     Procedures: Large Joint Inj: R knee on 05/09/2020 1:38 PM Indications: pain Details: 22 G needle  Arthrogram: No  Medications: 40 mg methylPREDNISolone acetate 40 MG/ML; 2 mL lidocaine 1 %; 2 mL bupivacaine 0.5 % Consent was given by the patient. Patient was prepped and draped in the usual sterile fashion.       Clinical Data: No additional findings.   Subjective: Chief Complaint  Patient presents with   Right Knee - Pain   Left Knee - Pain    Aimee Wright comes in today for evaluation of mainly right knee pain.  Recently she has had increasing pain that is worse with using the stairs.  Denies any mechanical symptoms.  Denies any injuries.  Denies any constitutional symptoms.  Her knee feels really tight when she tries to flex.  She has tried over-the-counter NSAIDs with no relief.   Review of Systems  Constitutional: Negative.   HENT: Negative.   Eyes: Negative.   Respiratory: Negative.   Cardiovascular: Negative.   Endocrine: Negative.   Musculoskeletal: Negative.   Neurological: Negative.   Hematological: Negative.     Psychiatric/Behavioral: Negative.   All other systems reviewed and are negative.    Objective: Vital Signs: Ht 5\' 11"  (1.803 m)    Wt (!) 273 lb 3.2 oz (123.9 kg)    BMI 38.10 kg/m   Physical Exam Vitals and nursing note reviewed.  Constitutional:      Appearance: She is well-developed.  Pulmonary:     Effort: Pulmonary effort is normal.  Skin:    General: Skin is warm.     Capillary Refill: Capillary refill takes less than 2 seconds.  Neurological:     Mental Status: She is alert and oriented to person, place, and time.  Psychiatric:        Behavior: Behavior normal.        Thought Content: Thought content normal.        Judgment: Judgment normal.     Ortho Exam Right knee shows a joint effusion.  Collaterals and cruciates are stable.  Painful range of motion past 90 degrees. Specialty Comments:  No specialty comments available.  Imaging: XR KNEE 3 VIEW LEFT  Result Date: 05/09/2020 Mild osteoarthritis.  No acute abnormalities.  XR KNEE 3 VIEW RIGHT  Result Date: 05/09/2020 Mild osteoarthritis.  No acute abnormalities.    PMFS History: Patient Active Problem List   Diagnosis Date Noted  Headache(784.0) 10/26/2013   Loss of consciousness (HCC) 10/26/2013   Past Medical History:  Diagnosis Date   Anorexia    Bipolar disorder, unspecified (HCC)    Bulimia    Depression    Generalized headaches    Impaired glucose tolerance     Family History  Problem Relation Age of Onset   Cancer - Colon Maternal Grandfather     Past Surgical History:  Procedure Laterality Date   KNEE SURGERY  1997   Social History   Occupational History   Occupation: Child psychotherapist  Tobacco Use   Smoking status: Never Smoker   Smokeless tobacco: Never Used  Substance and Sexual Activity   Alcohol use: Yes    Comment: 1 glass a wine daily    Drug use: No   Sexual activity: Not on file

## 2020-06-03 ENCOUNTER — Encounter: Payer: Self-pay | Admitting: Orthopaedic Surgery

## 2020-06-05 ENCOUNTER — Other Ambulatory Visit: Payer: Self-pay | Admitting: Obstetrics and Gynecology

## 2020-06-05 ENCOUNTER — Other Ambulatory Visit: Payer: Self-pay

## 2020-06-05 DIAGNOSIS — G8929 Other chronic pain: Secondary | ICD-10-CM

## 2020-06-05 DIAGNOSIS — N63 Unspecified lump in unspecified breast: Secondary | ICD-10-CM

## 2020-06-05 DIAGNOSIS — M25561 Pain in right knee: Secondary | ICD-10-CM

## 2020-06-05 NOTE — Telephone Encounter (Signed)
Can we get MRI right knee?

## 2020-06-05 NOTE — Telephone Encounter (Signed)
Do you think gel approval or MRI?

## 2020-06-05 NOTE — Telephone Encounter (Signed)
Sounds like effusion has reformed.  Yes let's get MRI.  Thanks.

## 2020-06-22 ENCOUNTER — Other Ambulatory Visit: Payer: Managed Care, Other (non HMO)

## 2020-06-24 ENCOUNTER — Other Ambulatory Visit: Payer: Self-pay

## 2020-06-24 ENCOUNTER — Ambulatory Visit
Admission: RE | Admit: 2020-06-24 | Discharge: 2020-06-24 | Disposition: A | Discharge status: Home / Self Care | Payer: Managed Care, Other (non HMO) | Source: Ambulatory Visit | Attending: Orthopaedic Surgery | Admitting: Orthopaedic Surgery

## 2020-06-24 DIAGNOSIS — G8929 Other chronic pain: Secondary | ICD-10-CM

## 2020-06-26 NOTE — Telephone Encounter (Signed)
Please call patient for an appointment this week. If she can come in this week then just cancel her appointment for next week.  Thanks.

## 2020-06-26 NOTE — Telephone Encounter (Signed)
I have left 2 voicemails.

## 2020-06-28 ENCOUNTER — Telehealth: Payer: Self-pay

## 2020-06-28 ENCOUNTER — Encounter: Payer: Self-pay | Admitting: Orthopaedic Surgery

## 2020-06-28 ENCOUNTER — Ambulatory Visit (INDEPENDENT_AMBULATORY_CARE_PROVIDER_SITE_OTHER): Payer: Managed Care, Other (non HMO) | Admitting: Orthopaedic Surgery

## 2020-06-28 VITALS — Ht 72.0 in | Wt 272.0 lb

## 2020-06-28 DIAGNOSIS — M25561 Pain in right knee: Secondary | ICD-10-CM | POA: Diagnosis not present

## 2020-06-28 DIAGNOSIS — G8929 Other chronic pain: Secondary | ICD-10-CM | POA: Diagnosis not present

## 2020-06-28 MED ORDER — LIDOCAINE HCL 1 % IJ SOLN
2.0000 mL | INTRAMUSCULAR | Status: AC | PRN
Start: 2020-06-28 — End: 2020-06-28
  Administered 2020-06-28: 2 mL

## 2020-06-28 MED ORDER — METHYLPREDNISOLONE ACETATE 40 MG/ML IJ SUSP
40.0000 mg | INTRAMUSCULAR | Status: AC | PRN
  Administered 2020-06-28: 40 mg via INTRA_ARTICULAR

## 2020-06-28 MED ORDER — BUPIVACAINE HCL 0.5 % IJ SOLN
2.0000 mL | INTRAMUSCULAR | Status: AC | PRN
  Administered 2020-06-28: 2 mL via INTRA_ARTICULAR

## 2020-06-28 NOTE — Telephone Encounter (Signed)
Please submit for gel approval thanks.

## 2020-06-28 NOTE — Progress Notes (Signed)
   Office Visit Note   Patient: Aimee Wright           Date of Birth: Oct 23, 1975           MRN: [id number] Visit Date: 06/28/2020              Requested by: Geoffry Paradise, MD [address] [place],  [place] [address] PCP: Geoffry Paradise, MD   Assessment & Plan: Visit Diagnoses:  1. Chronic pain of right knee     Plan: MRI shows grade 3 and 4 changes of the medial and patellofemoral compartments.  No meniscal pathology with no structural abnormalities.  These findings were reviewed with the patient in detail.  We repeated the aspiration and injection today.  We will also submit approval for Visco injection for the future.  40 cc obtained today and sent to the lab.  Follow-Up Instructions: No follow-ups on file.   Orders:  No orders of the defined types were placed in this encounter.  No orders of the defined types were placed in this encounter.     Procedures: Large Joint Inj: R knee on 06/28/2020 10:19 AM Indications: pain Details: 22 G needle  Arthrogram: No  Medications: 40 mg methylPREDNISolone acetate 40 MG/ML; 2 mL lidocaine 1 %; 2 mL bupivacaine 0.5 % Aspirate: 40 mL; sent for lab analysis Outcome: tolerated well, no immediate complications Consent was given by the patient. Patient was prepped and draped in the usual sterile fashion.       Clinical Data: No additional findings.   Subjective: Chief Complaint  Patient presents with  . Right Knee - Follow-up    MRI review    Aimee Wright returns today for MRI review and recurrent right knee effusion and chronic knee pain.   Review of Systems   Objective: Vital Signs: Ht 6' (1.829 m)   Wt 272 lb (123.4 kg)   BMI 36.89 kg/m   Physical Exam  Ortho Exam Right knee shows a moderate knee effusion.  No signs of infection.  Exam is otherwise unchanged. Specialty Comments:  No specialty comments available.  Imaging: No results found.   PMFS History: Patient Active Problem List   Diagnosis Date  Noted  . Headache(784.0) 10/26/2013  . Loss of consciousness (HCC) 10/26/2013   Past Medical History:  Diagnosis Date  . Anorexia   . Bipolar disorder, unspecified (HCC)   . Bulimia   . Depression   . Generalized headaches   . Impaired glucose tolerance     Family History  Problem Relation Age of Onset  . Cancer - Colon Maternal Grandfather     Past Surgical History:  Procedure Laterality Date  . KNEE SURGERY  1997   Social History   Occupational History  . Occupation: Child psychotherapist  Tobacco Use  . Smoking status: Never Smoker  . Smokeless tobacco: Never Used  Substance and Sexual Activity  . Alcohol use: Yes    Comment: 1 glass a wine daily   . Drug use: No  . Sexual activity: Not on file

## 2020-06-28 NOTE — Addendum Note (Signed)
Addended by: Wendi Maya on: 06/28/2020 10:36 AM   Modules accepted: Orders

## 2020-06-29 LAB — SYNOVIAL CELL COUNT + DIFF, W/ CRYSTALS
Basophils, %: 0 %
Eosinophils-Synovial: 0 % (ref 0–2)
Lymphocytes-Synovial Fld: 98 % — ABNORMAL HIGH (ref 0–74)
Monocyte/Macrophage: 1 % (ref 0–69)
Neutrophil, Synovial: 1 % (ref 0–24)
Synoviocytes, %: 0 % (ref 0–15)
WBC, Synovial: 488 cells/uL — ABNORMAL HIGH (ref <150)

## 2020-06-29 LAB — TIQ-NTM

## 2020-06-29 NOTE — Telephone Encounter (Signed)
Noted  

## 2020-06-29 NOTE — Progress Notes (Signed)
No gout or inflammatory arthritis.  Fairly typical looking fluid.

## 2020-07-05 ENCOUNTER — Ambulatory Visit: Payer: Managed Care, Other (non HMO) | Admitting: Orthopaedic Surgery

## 2020-07-13 ENCOUNTER — Telehealth: Payer: Self-pay

## 2020-07-13 NOTE — Telephone Encounter (Signed)
Submitted VOB, Monovisc, right knee. 

## 2020-07-20 ENCOUNTER — Telehealth: Payer: Self-pay

## 2020-07-20 NOTE — Telephone Encounter (Signed)
PA required for Monovisc, right knee. Faxed completed PA form to [employer] at [phone]. 

## 2020-08-17 ENCOUNTER — Other Ambulatory Visit: Payer: Managed Care, Other (non HMO)

## 2020-08-17 DIAGNOSIS — Z20822 Contact with and (suspected) exposure to covid-19: Secondary | ICD-10-CM

## 2020-08-18 LAB — NOVEL CORONAVIRUS, NAA: SARS-CoV-2, NAA: NOT DETECTED

## 2020-08-18 LAB — SARS-COV-2, NAA 2 DAY TAT

## 2020-09-14 ENCOUNTER — Telehealth: Payer: Self-pay

## 2020-09-14 NOTE — Telephone Encounter (Signed)
Approved for Monovisc-Right knee Dr. Tressie Stalker and Bill No Copay 15% OOP Prior auth required Auth # [id number] Dates: 07/20/20-10/29

## 2020-09-14 NOTE — Telephone Encounter (Signed)
Auth was only good from 07/20/20-08/10/20

## 2020-09-18 ENCOUNTER — Telehealth: Payer: Self-pay

## 2020-09-18 NOTE — Telephone Encounter (Signed)
Talked with [employer] and authorization has been extended.

## 2020-09-18 NOTE — Telephone Encounter (Signed)
Called and left a Vm for patient to return call to schedule an appointment with Dr. Roda Shutters for gel injection.  Talked with Aimee Wright [facility] who advised that authorization is now effective from 07/20/2020-10/12/2020.  Approved, Monovisc, right knee. Buy & Bill Must meet deductible first Patient will be responsible for 15% OOP. No Co-pay PA required PA Approval# [id number] Valid 07/20/2020- 10/12/2020

## 2020-11-15 ENCOUNTER — Ambulatory Visit: Payer: Managed Care, Other (non HMO) | Admitting: Physician Assistant

## 2020-11-27 ENCOUNTER — Ambulatory Visit (INDEPENDENT_AMBULATORY_CARE_PROVIDER_SITE_OTHER): Payer: Managed Care, Other (non HMO) | Admitting: Orthopaedic Surgery

## 2020-11-27 ENCOUNTER — Encounter: Payer: Self-pay | Admitting: Orthopaedic Surgery

## 2020-11-27 DIAGNOSIS — G8929 Other chronic pain: Secondary | ICD-10-CM | POA: Insufficient documentation

## 2020-11-27 DIAGNOSIS — M25561 Pain in right knee: Secondary | ICD-10-CM | POA: Diagnosis not present

## 2020-11-27 MED ORDER — PENNSAID 2 % EX SOLN: 2.0000 g | Freq: Two times a day (BID) | CUTANEOUS | 3 refills | Status: AC | PRN

## 2020-11-27 MED ORDER — METHYLPREDNISOLONE ACETATE 40 MG/ML IJ SUSP
40.0000 mg | INTRAMUSCULAR | Status: AC | PRN
  Administered 2020-11-27: 40 mg via INTRA_ARTICULAR

## 2020-11-27 MED ORDER — LIDOCAINE HCL 1 % IJ SOLN
2.0000 mL | INTRAMUSCULAR | Status: AC | PRN
  Administered 2020-11-27: 2 mL

## 2020-11-27 MED ORDER — BUPIVACAINE HCL 0.5 % IJ SOLN
2.0000 mL | INTRAMUSCULAR | Status: AC | PRN
  Administered 2020-11-27: 2 mL via INTRA_ARTICULAR

## 2020-11-27 NOTE — Progress Notes (Signed)
Office Visit Note   Patient: Aimee Wright           Date of Birth: 1976/04/30           MRN: [id number] Visit Date: 11/27/2020              Requested by: Geoffry Paradise, MD [address] [place],  [place] [address] PCP: Geoffry Paradise, MD   Assessment & Plan: Visit Diagnoses:  1. Chronic pain of right knee     Plan: I was able to aspirate 140 cc of clear joint fluid and then injected cortisone as well.  Compression wrap applied.  She is interested in trying Pennsaid.  We also briefly talked about Visco injections.  We will see her back as needed.  Follow-Up Instructions: Return if symptoms worsen or fail to improve.   Orders:  No orders of the defined types were placed in this encounter.  Meds ordered this encounter  Medications  . Diclofenac Sodium (PENNSAID) 2 % SOLN    Sig: Apply 2 g topically 2 (two) times daily as needed (to affected area).    Dispense:  112 g    Refill:  3      Procedures: Large Joint Inj: R knee on 11/27/2020 5:52 PM Indications: pain Details: 22 G needle  Arthrogram: No  Medications: 40 mg methylPREDNISolone acetate 40 MG/ML; 2 mL lidocaine 1 %; 2 mL bupivacaine 0.5 % Aspirate: 140 mL clear Outcome: tolerated well, no immediate complications Consent was given by the patient. Patient was prepped and draped in the usual sterile fashion.       Clinical Data: No additional findings.   Subjective: Chief Complaint  Patient presents with  . Right Knee - Pain    Aimee Wright returns today for recurrent right knee pain and effusion.  She has known tricompartmental OA.  We last saw her in September to drain the joint effusion and put cortisone in it.  She did well from this until couple weeks ago.  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  Denies any injuries.   Review of Systems  Constitutional: Negative.   HENT: Negative.   Eyes: Negative.    Respiratory: Negative.   Cardiovascular: Negative.   Endocrine: Negative.   Musculoskeletal: Negative.   Neurological: Negative.   Hematological: Negative.   Psychiatric/Behavioral: Negative.   All other systems reviewed and are negative.    Objective: Vital Signs: There were no vitals taken for this visit.  Physical Exam Vitals and nursing note reviewed.  Constitutional:      Appearance: She is well-developed and well-nourished.  Pulmonary:     Effort: Pulmonary effort is normal.  Skin:    General: Skin is warm.     Capillary Refill: Capillary refill takes less than 2 seconds.  Neurological:     Mental Status: She is alert and oriented to person, place, and time.  Psychiatric:        Mood and Affect: Mood and affect normal.        Behavior: Behavior normal.        Thought Content: Thought content normal.        Judgment: Judgment normal.     Ortho Exam Right knee shows a large joint effusion.  Limited flexion due to the effusion and pain.  Collaterals and cruciates are stable. Specialty Comments:  No specialty comments available.  Imaging: No results found.  PMFS History: Patient Active Problem List   Diagnosis Date Noted  . Chronic pain of right knee 11/27/2020  . Headache(784.0) 10/26/2013  . Loss of consciousness (HCC) 10/26/2013   Past Medical History:  Diagnosis Date  . Anorexia   . Bipolar disorder, unspecified (HCC)   . Bulimia   . Depression   . Generalized headaches   . Impaired glucose tolerance     Family History  Problem Relation Age of Onset  . Cancer - Colon Maternal Grandfather     Past Surgical History:  Procedure Laterality Date  . KNEE SURGERY  1997   Social History   Occupational History  . Occupation: Child psychotherapist  Tobacco Use  . Smoking status: Never Smoker  . Smokeless tobacco: Never Used  Substance and Sexual Activity  . Alcohol use: Yes    Comment: 1 glass a wine daily   . Drug use: No  . Sexual activity: Not on  file

## 2020-12-05 ENCOUNTER — Encounter: Payer: Self-pay | Admitting: Orthopaedic Surgery

## 2021-01-03 ENCOUNTER — Encounter: Payer: Self-pay | Admitting: Orthopaedic Surgery

## 2021-01-03 ENCOUNTER — Ambulatory Visit (INDEPENDENT_AMBULATORY_CARE_PROVIDER_SITE_OTHER): Payer: Managed Care, Other (non HMO) | Admitting: Orthopaedic Surgery

## 2021-01-03 VITALS — Ht 72.0 in | Wt 272.0 lb

## 2021-01-03 DIAGNOSIS — G8929 Other chronic pain: Secondary | ICD-10-CM | POA: Diagnosis not present

## 2021-01-03 DIAGNOSIS — M25561 Pain in right knee: Secondary | ICD-10-CM | POA: Diagnosis not present

## 2021-01-03 MED ORDER — MELOXICAM 7.5 MG PO TABS: 7.5000 mg | ORAL_TABLET | Freq: Two times a day (BID) | ORAL | 2 refills | Status: DC | PRN

## 2021-01-03 NOTE — Progress Notes (Signed)
Office Visit Note   Patient: Aimee Wright           Date of Birth: May 10, 1976           MRN: [id number] Visit Date: 01/03/2021              Requested by: Geoffry Paradise, MD [address] [place],  [place] [address] PCP: Geoffry Paradise, MD   Assessment & Plan: Visit Diagnoses:  1. Chronic pain of right knee     Plan: Aimee Wright reports no new symptoms other than the pain and effusion.  No new injuries.  Based on findings I have recommended repeat aspiration and cortisone injection with relative rest.  We also talked about low impact exercises and dieting.  I aspirated 60 cc of blood-tinged joint fluid today.  Cortisone administered as well.  Prescription for meloxicam.  Follow-up as needed.  Follow-Up Instructions: Return if symptoms worsen or fail to improve.   Orders:  Orders Placed This Encounter  Procedures  . Cell count + diff,  w/ cryst-synvl fld   Meds ordered this encounter  Medications  . meloxicam (MOBIC) 7.5 MG tablet    Sig: Take 1 tablet (7.5 mg total) by mouth 2 (two) times daily as needed for pain.    Dispense:  30 tablet    Refill:  2      Procedures: No procedures performed   Clinical Data: No additional findings.   Subjective: Chief Complaint  Patient presents with  . Right Knee - Follow-up    Aimee Wright returns today for chronic right knee pain.  I saw her about a month ago and aspirated 140 cc of inflammatory fluid from the right knee.  She states that the fluid returned but has gotten some better.  She still very active and has to do a lot of traveling.  She has increased pain with using steps.  She is leaving for another work trip this weekend so she would like to try another injection.   Review of Systems  Constitutional: Negative.   HENT: Negative.   Eyes: Negative.   Respiratory: Negative.   Cardiovascular: Negative.   Endocrine: Negative.   Musculoskeletal: Negative.   Neurological: Negative.   Hematological: Negative.    Psychiatric/Behavioral: Negative.   All other systems reviewed and are negative.    Objective: Vital Signs: Ht 6' (1.829 m)   Wt 272 lb (123.4 kg)   BMI 36.89 kg/m   Physical Exam Vitals and nursing note reviewed.  Constitutional:      Appearance: She is well-developed.  Pulmonary:     Effort: Pulmonary effort is normal.  Skin:    General: Skin is warm.     Capillary Refill: Capillary refill takes less than 2 seconds.  Neurological:     Mental Status: She is alert and oriented to person, place, and time.  Psychiatric:        Behavior: Behavior normal.        Thought Content: Thought content normal.        Judgment: Judgment normal.     Ortho Exam Right knee shows a moderate joint effusion.  Slight decreased range of motion secondary to effusion and discomfort. Specialty Comments:  No specialty comments available.  Imaging: No results found.   PMFS History: Patient Active Problem List   Diagnosis Date Noted  . Chronic pain of right knee 11/27/2020  . Headache(784.0) 10/26/2013  . Loss of consciousness (HCC) 10/26/2013   Past Medical History:  Diagnosis Date  . Anorexia   .  Bipolar disorder, unspecified (HCC)   . Bulimia   . Depression   . Generalized headaches   . Impaired glucose tolerance     Family History  Problem Relation Age of Onset  . Cancer - Colon Maternal Grandfather     Past Surgical History:  Procedure Laterality Date  . KNEE SURGERY  1997   Social History   Occupational History  . Occupation: Child psychotherapist  Tobacco Use  . Smoking status: Never Smoker  . Smokeless tobacco: Never Used  Substance and Sexual Activity  . Alcohol use: Yes    Comment: 1 glass a wine daily   . Drug use: No  . Sexual activity: Not on file

## 2021-01-04 LAB — SYNOVIAL FLUID ANALYSIS, COMPLETE
Basophils, %: 0 %
Eosinophils-Synovial: 0 % (ref 0–2)
Lymphocytes-Synovial Fld: 70 % (ref 0–74)
Monocyte/Macrophage: 20 % (ref 0–69)
Neutrophil, Synovial: 10 % (ref 0–24)
Synoviocytes, %: 0 % (ref 0–15)
WBC, Synovial: 373 cells/uL — ABNORMAL HIGH (ref <150)

## 2021-01-04 LAB — TIQ-NTM

## 2021-01-22 ENCOUNTER — Encounter (HOSPITAL_COMMUNITY): Payer: Self-pay

## 2021-01-22 ENCOUNTER — Encounter: Payer: Self-pay | Admitting: Orthopaedic Surgery

## 2021-01-22 ENCOUNTER — Emergency Department (HOSPITAL_COMMUNITY)
Admission: EM | Admit: 2021-01-22 | Discharge: 2021-01-23 | Disposition: A | Discharge status: Home / Self Care | Payer: Managed Care, Other (non HMO) | Attending: Emergency Medicine | Admitting: Emergency Medicine

## 2021-01-22 ENCOUNTER — Emergency Department (HOSPITAL_COMMUNITY): Payer: Managed Care, Other (non HMO)

## 2021-01-22 DIAGNOSIS — M545 Low back pain, unspecified: Secondary | ICD-10-CM | POA: Insufficient documentation

## 2021-01-22 LAB — URINALYSIS, ROUTINE W REFLEX MICROSCOPIC
Bilirubin Urine: NEGATIVE
Glucose, UA: NEGATIVE mg/dL
Hgb urine dipstick: NEGATIVE
Ketones, ur: NEGATIVE mg/dL
Leukocytes,Ua: NEGATIVE
Nitrite: NEGATIVE
Protein, ur: NEGATIVE mg/dL
Specific Gravity, Urine: 1.01 (ref 1.005–1.030)
pH: 6 (ref 5.0–8.0)

## 2021-01-22 LAB — PREGNANCY, URINE: Preg Test, Ur: NEGATIVE

## 2021-01-22 MED ORDER — OXYCODONE-ACETAMINOPHEN 5-325 MG PO TABS
1.0000 | ORAL_TABLET | Freq: Once | ORAL | Status: AC
  Administered 2021-01-22: 1 via ORAL
  Filled 2021-01-22: qty 1

## 2021-01-22 MED ORDER — METHOCARBAMOL 500 MG PO TABS
500.0000 mg | ORAL_TABLET | Freq: Once | ORAL | Status: AC
  Administered 2021-01-22: 500 mg via ORAL
  Filled 2021-01-22: qty 1

## 2021-01-22 MED ORDER — DEXAMETHASONE SODIUM PHOSPHATE 10 MG/ML IJ SOLN: 10.0000 mg | Freq: Once | INTRAMUSCULAR | Status: DC

## 2021-01-22 MED ORDER — DEXAMETHASONE SODIUM PHOSPHATE 10 MG/ML IJ SOLN
10.0000 mg | Freq: Once | INTRAMUSCULAR | Status: AC
  Administered 2021-01-22: 10 mg via INTRAMUSCULAR
  Filled 2021-01-22: qty 1

## 2021-01-22 NOTE — ED Triage Notes (Signed)
Emergency Medicine Provider Triage Evaluation Note  Aimee Wright , a 45 y.o. female  was evaluated in triage.  Pt complains of low back pain.  Patient states her symptoms initially started about 2 weeks ago when moving a heavy suitcase.  She has a current right knee injury and followed up with her orthopedist who recommended that she take meloxicam as well as sulindac.  She states she took this with relief.  She states that she was taking a step and once again began experiencing the same pain.  She states her pain is excruciating and radiates down the bilateral lower extremities.  Reports tingling of the lower extremities.  No numbness.  No bowel or bladder incontinence.  She states that she was having so much difficulty with ambulation due to her pain that she called EMS.  Physical Exam  BP (!) 139/96 (BP Location: Left Arm)   Pulse 98   Resp 16   Ht 6' (1.829 m)   Wt 99.8 kg   SpO2 98%   BMI 29.84 kg/m  Gen:   Awake, no distress   HEENT:  Atraumatic  Resp:  Normal effort  Cardiac:  Normal rate  Abd:   Nondistended, nontender  MSK:   Moves extremities without difficulty; moderate tenderness noted along the midline lumbar spine.  Additional mild bilateral paraspinal tenderness. Neuro:  Speech clear; strength is 5 out of 5 in the bilateral lower extremities;  Distal sensation intact  Medical Decision Making  Medically screening exam initiated at 9:22 PM.  Appropriate orders placed.  Senie Lanese was informed that the remainder of the evaluation will be completed by another provider, this initial triage assessment does not replace that evaluation, and the importance of remaining in the ED until their evaluation is complete.   Placido Sou, PA-C 01/22/21 2124

## 2021-01-22 NOTE — ED Triage Notes (Signed)
Pt c/o midline lower back pain x 1 wk, seen by ortho and given meloxicam and sulindac. States it has been improving but today began having increased pain. Denies injury, numbness/tingling

## 2021-01-23 ENCOUNTER — Other Ambulatory Visit: Payer: Self-pay | Admitting: Orthopaedic Surgery

## 2021-01-23 MED ORDER — PREDNISONE 10 MG (21) PO TBPK: ORAL_TABLET | ORAL | 0 refills | Status: AC

## 2021-01-23 MED ORDER — METHOCARBAMOL 500 MG PO TABS: 500.0000 mg | ORAL_TABLET | Freq: Two times a day (BID) | ORAL | 0 refills | Status: AC

## 2021-01-23 NOTE — Discharge Instructions (Addendum)

## 2021-01-23 NOTE — ED Provider Notes (Signed)
[facility]-EMERGENCY DEPT Provider Note   CSN: [id number] Arrival date & time: 01/22/21  1949     History Chief Complaint  Patient presents with  . Back Pain    Aimee Wright is a 45 y.o. female.  The history is provided by the patient and a parent.  Back Pain Location:  Lumbar spine Quality:  Stabbing (spasms) Pain severity:  Severe Onset quality:  Gradual Duration:  2 weeks Timing:  Intermittent Progression:  Worsening Chronicity:  New Relieved by:  Bed rest Worsened by:  Ambulation Associated symptoms: no abdominal pain, no bladder incontinence, no bowel incontinence, no dysuria, no fever, no numbness and no weakness   Risk factors: no hx of cancer and no recent surgery   Patient presents with back pain ongoing for the past 2 weeks.  Patient reports that its been gradually getting worse and now feels like it's stabbing and spasms in her low back.  No focal weakness.  No trauma.  No incontinence.  No previous back surgery.  Patient has had recent issues with her knees, I suspect that may have disrupted her gait causing the pain.      Past Medical History:  Diagnosis Date  . Anorexia   . Bipolar disorder, unspecified (HCC)   . Bulimia   . Depression   . Generalized headaches   . Impaired glucose tolerance     Patient Active Problem List   Diagnosis Date Noted  . Chronic pain of right knee 11/27/2020  . Headache(784.0) 10/26/2013  . Loss of consciousness (HCC) 10/26/2013    Past Surgical History:  Procedure Laterality Date  . KNEE SURGERY  1997     OB History   No obstetric history on file.     Family History  Problem Relation Age of Onset  . Cancer - Colon Maternal Grandfather     Social History   Tobacco Use  . Smoking status: Never Smoker  . Smokeless tobacco: Never Used  Substance Use Topics  . Alcohol use: Yes    Comment: 1 glass a wine daily   . Drug use: No    Home Medications Prior to Admission medications    Medication Sig Start Date End Date Taking? Authorizing Provider  Diclofenac Sodium (PENNSAID) 2 % SOLN Apply 2 g topically 2 (two) times daily as needed (to affected area). 11/27/20   Tarry Kos, MD  lamoTRIgine (LAMICTAL) 100 MG tablet Take 100 mg by mouth 2 (two) times daily. 09/25/13   [provider]  meloxicam (MOBIC) 7.5 MG tablet Take 1 tablet (7.5 mg total) by mouth 2 (two) times daily as needed for pain. 01/03/21   Tarry Kos, MD  QUEtiapine (SEROQUEL) 25 MG tablet Take 25 mg by mouth at bedtime.    [provider]    Allergies    Patient has no known allergies.  Review of Systems   Review of Systems  Constitutional: Negative for fever.  Gastrointestinal: Negative for abdominal pain and bowel incontinence.  Genitourinary: Negative for bladder incontinence and dysuria.  Musculoskeletal: Positive for back pain.  Neurological: Negative for weakness and numbness.  All other systems reviewed and are negative.   Physical Exam Updated Vital Signs BP (!) 140/91 (BP Location: Right Arm)   Pulse 70   Temp 97.7 F (36.5 C) (Oral)   Resp 17   Ht 1.829 m (6')   Wt 99.8 kg   SpO2 100%   BMI 29.84 kg/m   Physical Exam CONSTITUTIONAL: Well developed/well  nourished HEAD: Normocephalic/atraumatic EYES: EOMI/PERRL ENMT: Mucous membranes moist NECK: supple no meningeal signs SPINE/BACK:entire spine nontender no bruising/crepitance/stepoffs noted to spine CV: S1/S2 noted, no murmurs/rubs/gallops noted LUNGS: Lungs are clear to auscultation bilaterally, no apparent distress ABDOMEN: soft, nontender, no rebound or guarding GU:no cva tenderness NEURO: Awake/alert,equal motor 5/5 strength noted with the following: hip flexion/knee flexion/extension, foot dorsi/plantar flexion, great toe extension intact bilaterally, plantar reflex appropriate (toes downgoing), no sensory deficit in any dermatome.  Equal patellar/achilles reflex noted (2+) in bilateral lower  extremities.   EXTREMITIES: pulses normal, full ROM SKIN: warm, color normal PSYCH: no abnormalities of mood noted, alert and oriented to situation   ED Results / Procedures / Treatments   Labs (all labs ordered are listed, but only abnormal results are displayed) Labs Reviewed  URINALYSIS, ROUTINE W REFLEX MICROSCOPIC  PREGNANCY, URINE    EKG None  Radiology DG Lumbar Spine Complete  Result Date: 01/22/2021 CLINICAL DATA:  Midline lumbar pain EXAM: LUMBAR SPINE - COMPLETE 4+ VIEW COMPARISON:  04/29/2019 FINDINGS: Lumbar alignment within normal limits. Mild disc space narrowing at L5-S1. Remaining disc spaces are patent. Vertebral body heights are maintained. IMPRESSION: Mild degenerative change at L5-S1. Electronically Signed   By: Jasmine Pang M.D.   On: 01/22/2021 21:49    Procedures Procedures   Medications Ordered in ED Medications  oxyCODONE-acetaminophen (PERCOCET/ROXICET) 5-325 MG per tablet 1 tablet (1 tablet Oral Given 01/22/21 2254)  dexamethasone (DECADRON) injection 10 mg (10 mg Intramuscular Given 01/22/21 2254)  methocarbamol (ROBAXIN) tablet 500 mg (500 mg Oral Given 01/22/21 2338)    ED Course  I have reviewed the triage vital signs and the nursing notes.  Pertinent labs & imaging results that were available during my care of the patient were reviewed by me and considered in my medical decision making (see chart for details).    MDM Rules/Calculators/A&P                         12:47 AM Patient presents with ongoing back pain for the past 2 weeks.  Patient feels improved, she is ambulatory.  No focal neuro deficits.  Imaging and labs were reviewed and are unremarkable.  Suspect muscle spasm.  She responded well to Robaxin.  She will continued meloxicam and will start Robaxin.  Follow-up with orthopedist in 2 weeks.  We discussed strict return precautions  Final Clinical Impression(s) / ED Diagnoses Final diagnoses:  Acute midline low back pain without  sciatica    Rx / DC Orders ED Discharge Orders         Ordered    methocarbamol (ROBAXIN) 500 MG tablet  2 times daily        01/23/21 0038           Zadie Rhine, MD 01/23/21 [phone]

## 2021-02-07 ENCOUNTER — Encounter: Payer: Self-pay | Admitting: Orthopaedic Surgery

## 2021-02-08 ENCOUNTER — Other Ambulatory Visit: Payer: Self-pay | Admitting: Physician Assistant

## 2021-02-08 MED ORDER — MELOXICAM 7.5 MG PO TABS: 7.5000 mg | ORAL_TABLET | Freq: Two times a day (BID) | ORAL | 2 refills | Status: AC | PRN

## 2021-02-08 NOTE — Telephone Encounter (Signed)
Xu, I went ahead and sent in more mobic, but figured you would want to respond to her as I have not been seeing her

## 2021-02-26 ENCOUNTER — Ambulatory Visit: Payer: Managed Care, Other (non HMO) | Admitting: Orthopaedic Surgery

## 2021-03-05 ENCOUNTER — Ambulatory Visit (INDEPENDENT_AMBULATORY_CARE_PROVIDER_SITE_OTHER): Payer: Managed Care, Other (non HMO) | Admitting: Orthopaedic Surgery

## 2021-03-05 ENCOUNTER — Other Ambulatory Visit: Payer: Self-pay

## 2021-03-05 DIAGNOSIS — G8929 Other chronic pain: Secondary | ICD-10-CM

## 2021-03-05 DIAGNOSIS — M25561 Pain in right knee: Secondary | ICD-10-CM | POA: Diagnosis not present

## 2021-03-05 DIAGNOSIS — M545 Low back pain, unspecified: Secondary | ICD-10-CM

## 2021-03-05 NOTE — Addendum Note (Signed)
Addended by: Wendi Maya on: 03/05/2021 01:00 PM   Modules accepted: Orders

## 2021-03-05 NOTE — Progress Notes (Signed)
   Office Visit Note   Patient: Aimee Wright           Date of Birth: 1975/10/22           MRN: [id number] Visit Date: 03/05/2021              Requested by: Geoffry Paradise, MD [address] [place],  [place] [address] PCP: Geoffry Paradise, MD   Assessment & Plan: Visit Diagnoses:  1. Low back pain, unspecified back pain laterality, unspecified chronicity, unspecified whether sciatica present   2. Chronic pain of right knee     Plan: In regards to her back she continues to have flareups when she is not on prednisone.  Given the chronicity of her symptoms and the lack of response to conservative treatments we will need to obtain MRI of the lumbar spine to rule out structural abnormalities.  In regards to the right knee she continues to have persistent effusions despite multiple aspirations and cortisone injections therefore we will also need to order an MRI to rule out structural abnormalities.  I will call the patient with with the MRI results.  Follow-Up Instructions: Return for I will call patient with MRI results..   Orders:  No orders of the defined types were placed in this encounter.  No orders of the defined types were placed in this encounter.     Procedures: No procedures performed   Clinical Data: No additional findings.   Subjective: Chief Complaint  Patient presents with  . Right Knee - Pain  . Lower Back - Pain    Aimee Wright returns today for follow-up of continued right knee pain and swelling and chronic low back pain with periodic flareups.  She has been traveling a lot for work.  Denies any injuries.  Currently on the last day of her prednisone Dosepak.   Review of Systems   Objective: Vital Signs: There were no vitals taken for this visit.  Physical Exam  Ortho Exam Right knee shows a small effusion.  Range of motion is well-maintained.  Adequate strength. Low back exam shows no significant tenderness of her back.  Normal range of motion.  No  focal motor or sensory deficits. Specialty Comments:  No specialty comments available.  Imaging: No results found.   PMFS History: Patient Active Problem List   Diagnosis Date Noted  . Chronic pain of right knee 11/27/2020  . Headache(784.0) 10/26/2013  . Loss of consciousness (HCC) 10/26/2013   Past Medical History:  Diagnosis Date  . Anorexia   . Bipolar disorder, unspecified (HCC)   . Bulimia   . Depression   . Generalized headaches   . Impaired glucose tolerance     Family History  Problem Relation Age of Onset  . Cancer - Colon Maternal Grandfather     Past Surgical History:  Procedure Laterality Date  . KNEE SURGERY  1997   Social History   Occupational History  . Occupation: Child psychotherapist  Tobacco Use  . Smoking status: Never Smoker  . Smokeless tobacco: Never Used  Substance and Sexual Activity  . Alcohol use: Yes    Comment: 1 glass a wine daily   . Drug use: No  . Sexual activity: Not on file

## 2021-04-02 ENCOUNTER — Other Ambulatory Visit: Payer: Managed Care, Other (non HMO)

## 2021-09-19 ENCOUNTER — Telehealth: Payer: Self-pay | Admitting: Orthopaedic Surgery

## 2021-09-19 NOTE — Telephone Encounter (Signed)
Drain more fluid/assess pain and swelling in right knee (mychart message)- left message to call back and sch with Roda Shutters

## 2021-10-22 ENCOUNTER — Ambulatory Visit: Payer: Managed Care, Other (non HMO) | Admitting: Orthopaedic Surgery

## 2022-08-22 IMAGING — CR DG LUMBAR SPINE COMPLETE 4+V
5 series · 5 of 5 positions shown · non-contrast
Comparison: 04/29/2019

CLINICAL DATA: Midline lumbar pain

EXAM:
LUMBAR SPINE - COMPLETE 4+ VIEW

[t lumbar spine ap]
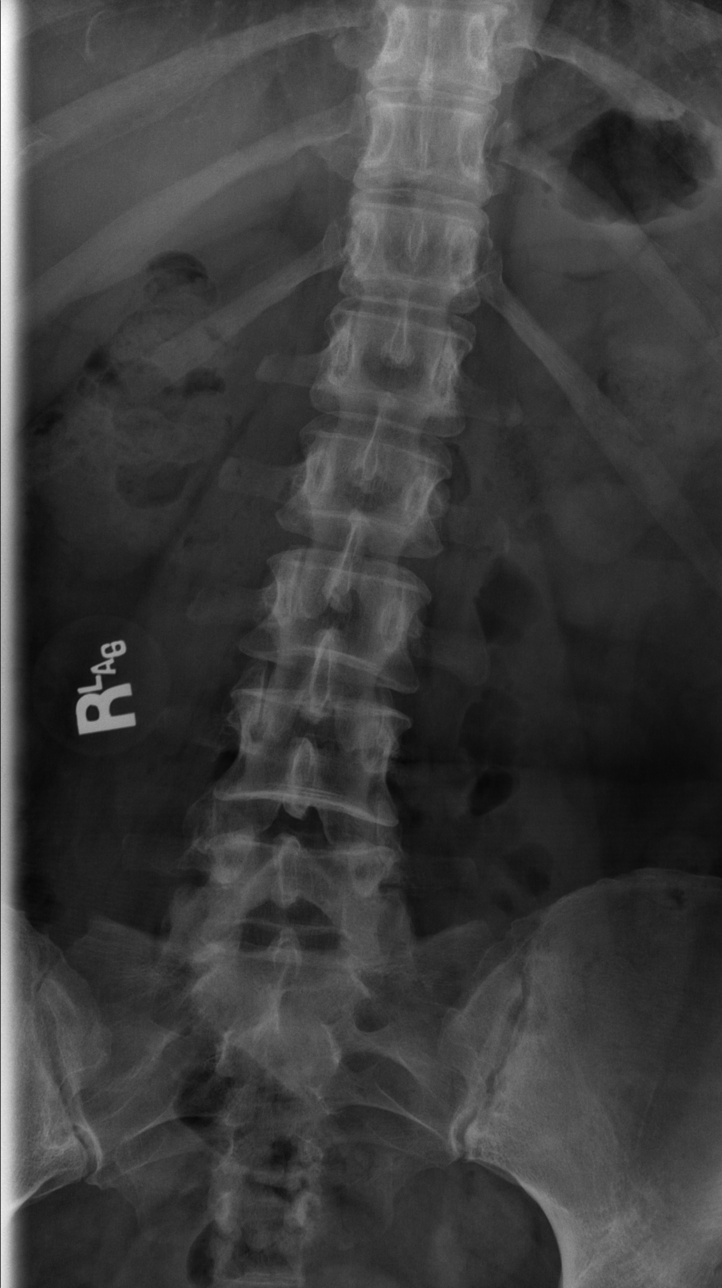

[t lumbar spine obl (1 of 2)]
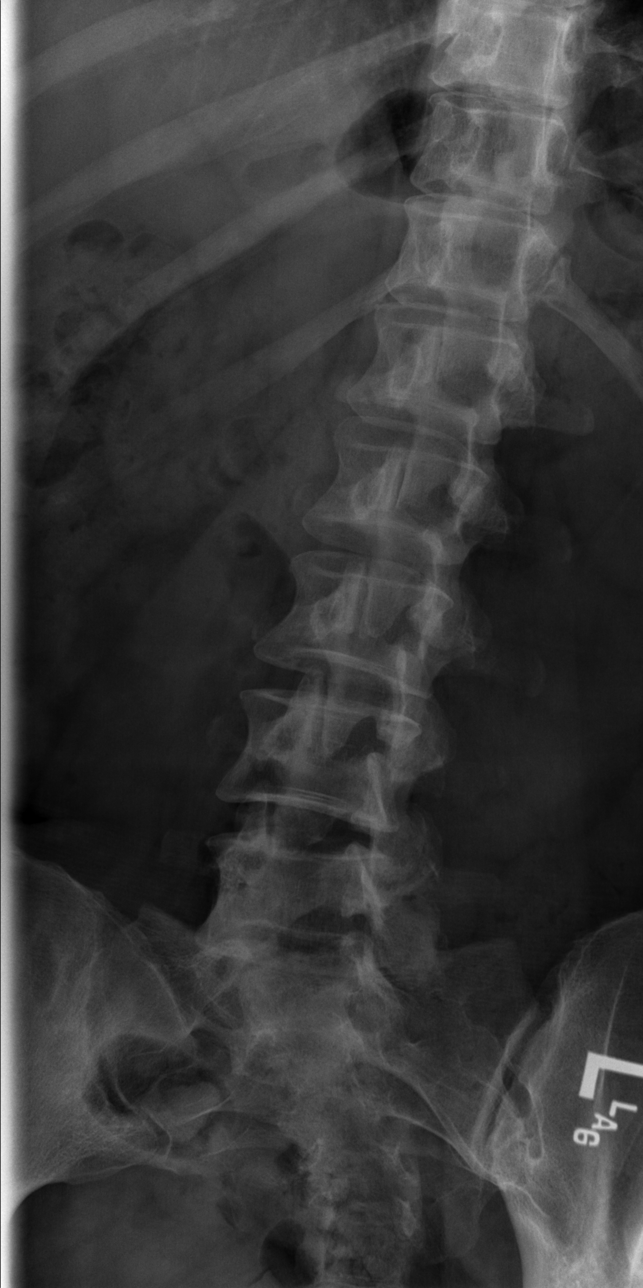

[t lumbar spine obl (2 of 2)]
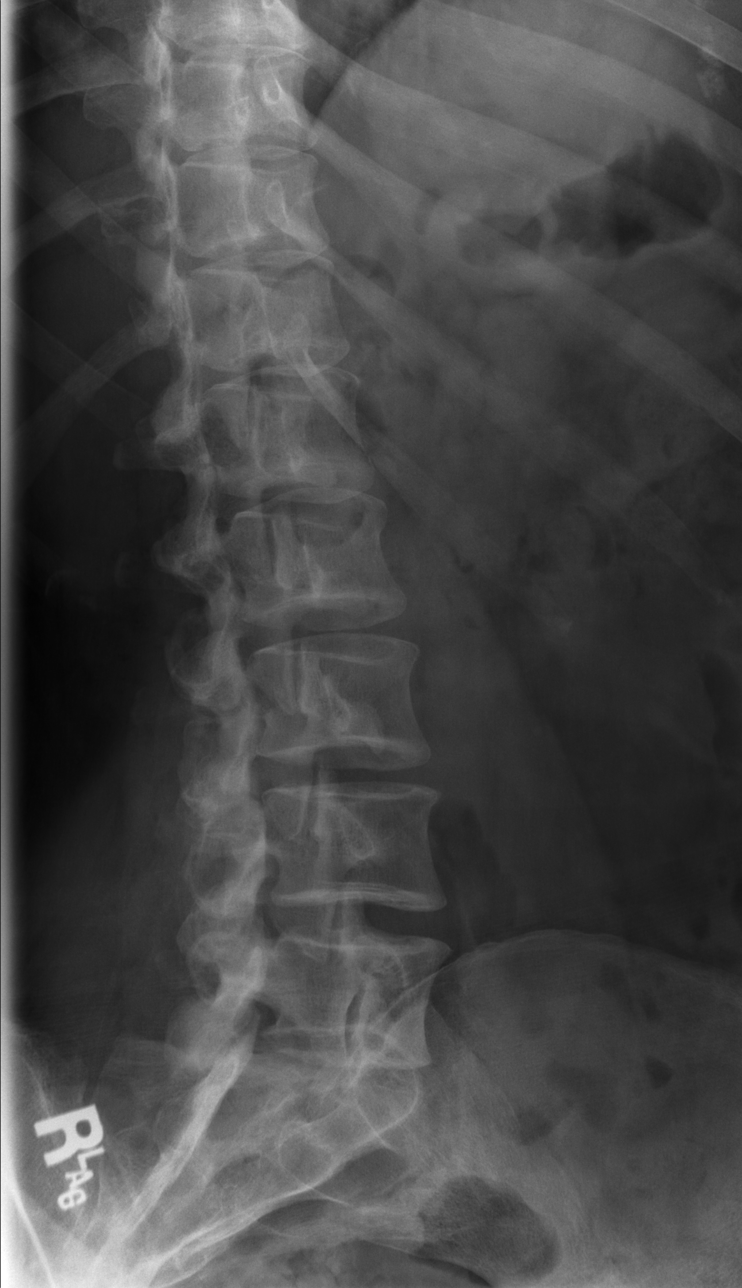

[t lumbar spine lat]
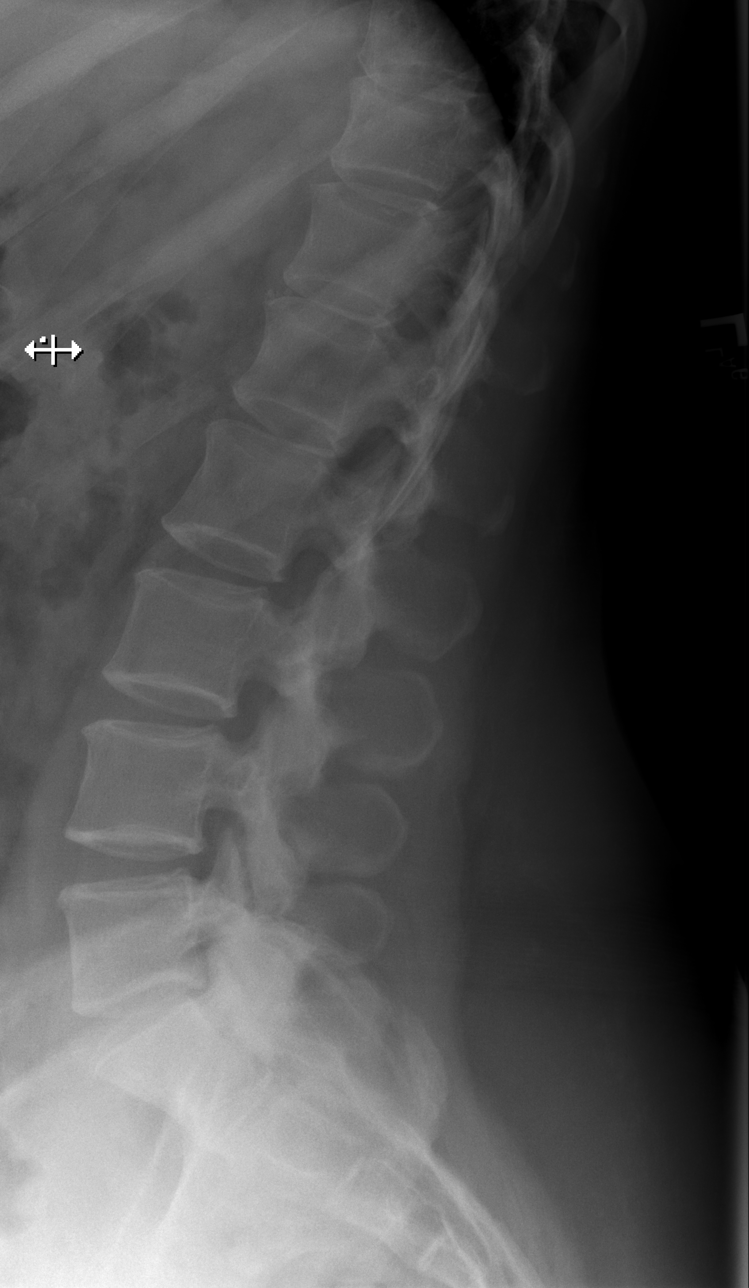

[t lumbar l-5 s-1 spot]
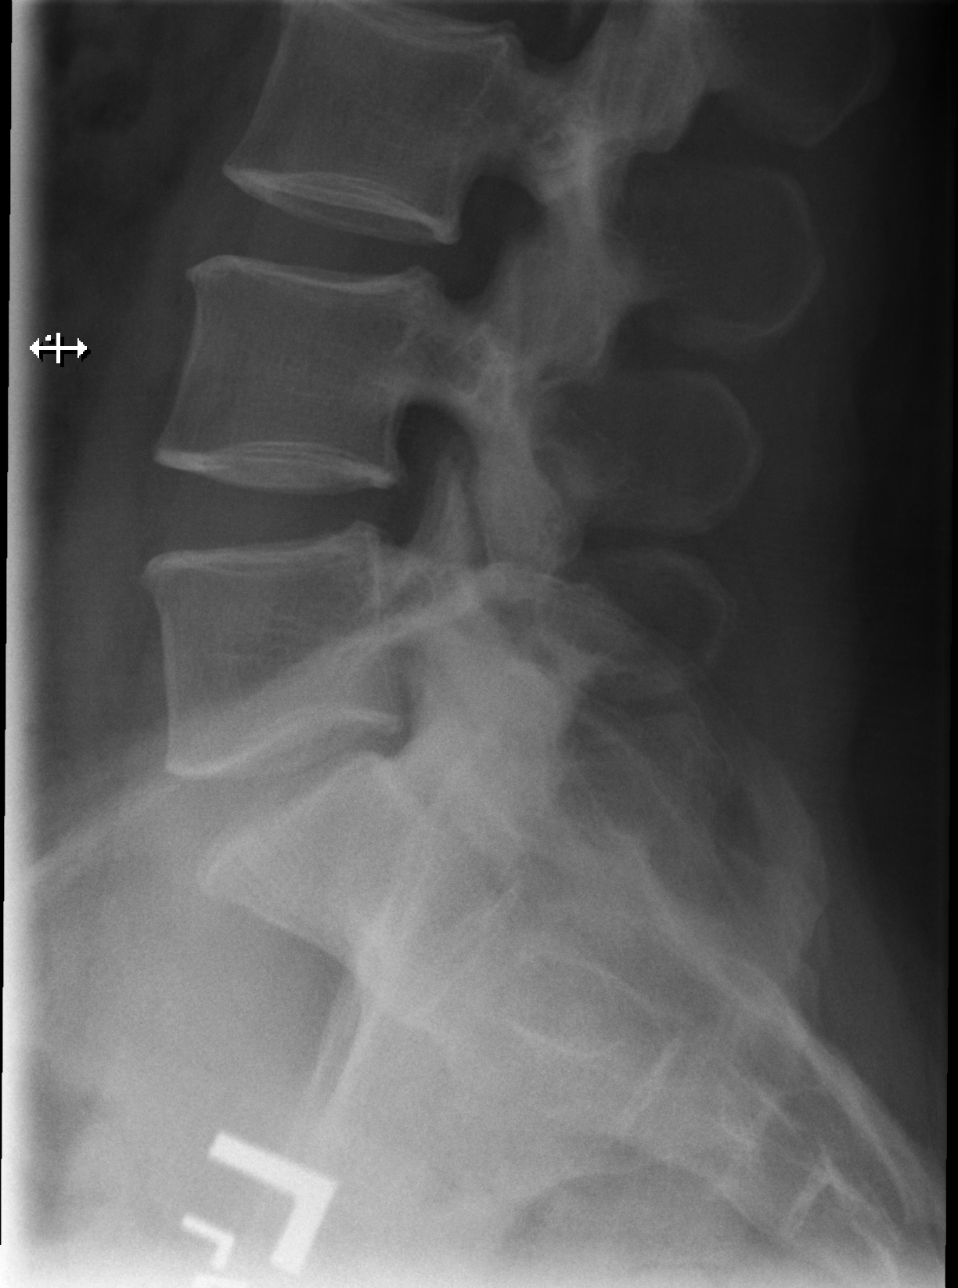

[5 of 5 positions shown; findings below may reference images not displayed]

FINDINGS: Lumbar alignment within normal limits. Mild disc space narrowing at
L5-S1. Remaining disc spaces are patent. Vertebral body heights are
maintained.
IMPRESSION: Mild degenerative change at L5-S1.
